# Patient Record
Sex: Female | Born: 1980 | Race: Black or African American | Hispanic: No | Marital: Single | Smoking: Never smoker
Health system: Southern US, Community
[De-identification: ages and names within clinical notes are randomized; demographics above are authoritative.]

## PROBLEM LIST (undated history)

## (undated) ENCOUNTER — Inpatient Hospital Stay (HOSPITAL_COMMUNITY): Payer: Self-pay

## (undated) DIAGNOSIS — G43909 Migraine, unspecified, not intractable, without status migrainosus: Secondary | ICD-10-CM

## (undated) DIAGNOSIS — H6690 Otitis media, unspecified, unspecified ear: Secondary | ICD-10-CM

## (undated) HISTORY — PX: LEEP: SHX91

---

## 2006-09-29 ENCOUNTER — Emergency Department (HOSPITAL_COMMUNITY): Admission: EM | Admit: 2006-09-29 | Discharge: 2006-09-29 | Payer: Self-pay | Admitting: Emergency Medicine

## 2006-10-01 ENCOUNTER — Emergency Department (HOSPITAL_COMMUNITY): Admission: EM | Admit: 2006-10-01 | Discharge: 2006-10-01 | Payer: Self-pay | Admitting: Emergency Medicine

## 2006-11-02 ENCOUNTER — Ambulatory Visit: Payer: Self-pay | Admitting: Internal Medicine

## 2006-11-06 ENCOUNTER — Ambulatory Visit: Payer: Self-pay | Admitting: *Deleted

## 2006-11-29 ENCOUNTER — Ambulatory Visit: Payer: Self-pay | Admitting: Internal Medicine

## 2006-12-19 ENCOUNTER — Ambulatory Visit: Payer: Self-pay | Admitting: Internal Medicine

## 2007-01-01 ENCOUNTER — Encounter: Payer: Self-pay | Admitting: Internal Medicine

## 2007-01-01 ENCOUNTER — Ambulatory Visit: Payer: Self-pay | Admitting: Family Medicine

## 2007-04-21 ENCOUNTER — Emergency Department (HOSPITAL_COMMUNITY): Admission: EM | Admit: 2007-04-21 | Discharge: 2007-04-21 | Payer: Self-pay | Admitting: Emergency Medicine

## 2007-07-24 ENCOUNTER — Emergency Department (HOSPITAL_COMMUNITY): Admission: EM | Admit: 2007-07-24 | Discharge: 2007-07-24 | Payer: Self-pay | Admitting: Emergency Medicine

## 2007-10-29 ENCOUNTER — Ambulatory Visit: Payer: Self-pay | Admitting: Internal Medicine

## 2007-12-24 ENCOUNTER — Ambulatory Visit: Payer: Self-pay | Admitting: Internal Medicine

## 2007-12-24 ENCOUNTER — Encounter: Payer: Self-pay | Admitting: Internal Medicine

## 2007-12-24 LAB — CONVERTED CEMR LAB
BUN: 14 mg/dL (ref 6–23)
Basophils Absolute: 0 10*3/uL (ref 0.0–0.1)
CO2: 21 meq/L (ref 19–32)
Calcium: 9.1 mg/dL (ref 8.4–10.5)
Chlamydia, DNA Probe: NEGATIVE
Cholesterol: 119 mg/dL (ref 0–200)
Creatinine, Ser: 0.78 mg/dL (ref 0.40–1.20)
Glucose, Bld: 73 mg/dL (ref 70–99)
Hemoglobin: 13.2 g/dL (ref 12.0–15.0)
Lymphocytes Relative: 21 % (ref 12–46)
Monocytes Absolute: 0.6 10*3/uL (ref 0.1–1.0)
Potassium: 3.9 meq/L (ref 3.5–5.3)
RDW: 13.1 % (ref 11.5–15.5)
VLDL: 19 mg/dL (ref 0–40)
WBC: 9.1 10*3/uL (ref 4.0–10.5)

## 2008-01-11 ENCOUNTER — Emergency Department (HOSPITAL_COMMUNITY): Admission: EM | Admit: 2008-01-11 | Discharge: 2008-01-11 | Payer: Self-pay | Admitting: Emergency Medicine

## 2008-01-13 ENCOUNTER — Emergency Department (HOSPITAL_COMMUNITY): Admission: EM | Admit: 2008-01-13 | Discharge: 2008-01-13 | Payer: Self-pay | Admitting: Emergency Medicine

## 2008-08-24 ENCOUNTER — Emergency Department (HOSPITAL_COMMUNITY): Admission: EM | Admit: 2008-08-24 | Discharge: 2008-08-24 | Payer: Self-pay | Admitting: Emergency Medicine

## 2008-12-24 ENCOUNTER — Emergency Department (HOSPITAL_BASED_OUTPATIENT_CLINIC_OR_DEPARTMENT_OTHER): Admission: EM | Admit: 2008-12-24 | Discharge: 2008-12-24 | Payer: Self-pay | Admitting: Emergency Medicine

## 2009-01-15 ENCOUNTER — Emergency Department (HOSPITAL_BASED_OUTPATIENT_CLINIC_OR_DEPARTMENT_OTHER): Admission: EM | Admit: 2009-01-15 | Discharge: 2009-01-15 | Payer: Self-pay | Admitting: Emergency Medicine

## 2009-01-16 ENCOUNTER — Emergency Department (HOSPITAL_COMMUNITY): Admission: EM | Admit: 2009-01-16 | Discharge: 2009-01-16 | Payer: Self-pay | Admitting: Emergency Medicine

## 2009-01-19 ENCOUNTER — Emergency Department (HOSPITAL_COMMUNITY): Admission: EM | Admit: 2009-01-19 | Discharge: 2009-01-19 | Payer: Self-pay | Admitting: Family Medicine

## 2009-02-24 ENCOUNTER — Emergency Department (HOSPITAL_COMMUNITY): Admission: EM | Admit: 2009-02-24 | Discharge: 2009-02-24 | Payer: Self-pay | Admitting: Emergency Medicine

## 2009-02-26 ENCOUNTER — Emergency Department (HOSPITAL_BASED_OUTPATIENT_CLINIC_OR_DEPARTMENT_OTHER): Admission: EM | Admit: 2009-02-26 | Discharge: 2009-02-26 | Payer: Self-pay | Admitting: Emergency Medicine

## 2009-05-15 ENCOUNTER — Emergency Department (HOSPITAL_COMMUNITY): Admission: EM | Admit: 2009-05-15 | Discharge: 2009-05-15 | Payer: Self-pay | Admitting: Emergency Medicine

## 2009-10-07 ENCOUNTER — Emergency Department (HOSPITAL_BASED_OUTPATIENT_CLINIC_OR_DEPARTMENT_OTHER): Admission: EM | Admit: 2009-10-07 | Discharge: 2009-10-07 | Payer: Self-pay | Admitting: Emergency Medicine

## 2009-10-07 ENCOUNTER — Ambulatory Visit: Payer: Self-pay | Admitting: Diagnostic Radiology

## 2010-03-24 ENCOUNTER — Emergency Department (HOSPITAL_BASED_OUTPATIENT_CLINIC_OR_DEPARTMENT_OTHER): Admission: EM | Admit: 2010-03-24 | Discharge: 2010-03-24 | Payer: Self-pay | Admitting: Emergency Medicine

## 2010-03-24 ENCOUNTER — Ambulatory Visit: Payer: Self-pay | Admitting: Radiology

## 2010-11-19 LAB — CBC
MCHC: 32.5 g/dL (ref 30.0–36.0)
MCV: 89.2 fL (ref 78.0–100.0)
Platelets: 296 10*3/uL (ref 150–400)

## 2010-11-19 LAB — DIFFERENTIAL
Eosinophils Relative: 1 % (ref 0–5)
Lymphocytes Relative: 13 % (ref 12–46)
Lymphs Abs: 1.3 10*3/uL (ref 0.7–4.0)
Monocytes Absolute: 0.5 10*3/uL (ref 0.1–1.0)
Neutro Abs: 7.6 10*3/uL (ref 1.7–7.7)
Neutrophils Relative %: 79 % — ABNORMAL HIGH (ref 43–77)

## 2010-11-19 LAB — BASIC METABOLIC PANEL
Calcium: 9.1 mg/dL (ref 8.4–10.5)
Chloride: 105 mEq/L (ref 96–112)
GFR calc Af Amer: >60 mL/min (ref >60)
GFR calc non Af Amer: >60 mL/min (ref >60)
Sodium: 144 mEq/L (ref 135–145)

## 2010-11-19 LAB — URINALYSIS, ROUTINE W REFLEX MICROSCOPIC
Hgb urine dipstick: NEGATIVE
Ketones, ur: NEGATIVE mg/dL
Nitrite: NEGATIVE
Urobilinogen, UA: 1 mg/dL (ref 0.0–1.0)
pH: 6 (ref 5.0–8.0)

## 2010-11-23 LAB — URINALYSIS, ROUTINE W REFLEX MICROSCOPIC
Bilirubin Urine: NEGATIVE
Protein, ur: NEGATIVE mg/dL

## 2010-11-23 LAB — GC/CHLAMYDIA PROBE AMP, GENITAL
Chlamydia, DNA Probe: NEGATIVE
GC Probe Amp, Genital: NEGATIVE

## 2010-11-23 LAB — WET PREP, GENITAL

## 2010-12-09 LAB — WET PREP, GENITAL: Trich, Wet Prep: NONE SEEN

## 2010-12-09 LAB — URINALYSIS, ROUTINE W REFLEX MICROSCOPIC
Bilirubin Urine: NEGATIVE
Ketones, ur: NEGATIVE mg/dL
Protein, ur: NEGATIVE mg/dL

## 2010-12-09 LAB — PREGNANCY, URINE: Preg Test, Ur: NEGATIVE

## 2010-12-09 LAB — GC/CHLAMYDIA PROBE AMP, GENITAL: GC Probe Amp, Genital: NEGATIVE

## 2010-12-12 LAB — URINALYSIS, ROUTINE W REFLEX MICROSCOPIC
Nitrite: NEGATIVE
Protein, ur: NEGATIVE mg/dL
Urobilinogen, UA: 1 mg/dL (ref 0.0–1.0)
pH: 5.5 (ref 5.0–8.0)

## 2010-12-12 LAB — URINE MICROSCOPIC-ADD ON

## 2010-12-12 LAB — WET PREP, GENITAL

## 2010-12-12 LAB — GC/CHLAMYDIA PROBE AMP, GENITAL: Chlamydia, DNA Probe: NEGATIVE

## 2010-12-12 LAB — PREGNANCY, URINE: Preg Test, Ur: NEGATIVE

## 2010-12-14 LAB — URINALYSIS, ROUTINE W REFLEX MICROSCOPIC
Bilirubin Urine: NEGATIVE
Ketones, ur: NEGATIVE mg/dL
Nitrite: NEGATIVE
Protein, ur: NEGATIVE mg/dL
Urobilinogen, UA: 1 mg/dL (ref 0.0–1.0)
pH: 5.5 (ref 5.0–8.0)

## 2010-12-14 LAB — WET PREP, GENITAL

## 2010-12-14 LAB — GC/CHLAMYDIA PROBE AMP, GENITAL: Chlamydia, DNA Probe: NEGATIVE

## 2011-02-28 ENCOUNTER — Other Ambulatory Visit: Payer: Self-pay | Admitting: Family Medicine

## 2011-02-28 ENCOUNTER — Other Ambulatory Visit (HOSPITAL_COMMUNITY)
Admission: RE | Admit: 2011-02-28 | Discharge: 2011-02-28 | Disposition: A | Discharge status: Home / Self Care | Payer: BC Managed Care – PPO | Source: Ambulatory Visit | Attending: Family Medicine | Admitting: Family Medicine

## 2011-02-28 DIAGNOSIS — Z124 Encounter for screening for malignant neoplasm of cervix: Secondary | ICD-10-CM | POA: Insufficient documentation

## 2011-06-09 LAB — URINALYSIS, ROUTINE W REFLEX MICROSCOPIC
Bilirubin Urine: NEGATIVE
Nitrite: NEGATIVE
Specific Gravity, Urine: 1.014 (ref 1.005–1.030)
Urobilinogen, UA: 0.2 mg/dL (ref 0.0–1.0)
pH: 5.5 (ref 5.0–8.0)

## 2011-06-09 LAB — PREGNANCY, URINE: Preg Test, Ur: NEGATIVE

## 2011-06-09 LAB — WET PREP, GENITAL: Trich, Wet Prep: NONE SEEN

## 2011-06-09 LAB — GC/CHLAMYDIA PROBE AMP, GENITAL: Chlamydia, DNA Probe: NEGATIVE

## 2011-06-09 LAB — GLUCOSE, CAPILLARY

## 2011-06-13 LAB — URINALYSIS, ROUTINE W REFLEX MICROSCOPIC
Ketones, ur: NEGATIVE
Nitrite: POSITIVE — AB
Protein, ur: 100 — AB
pH: 6

## 2011-06-13 LAB — POCT PREGNANCY, URINE
Operator id: 285491
Preg Test, Ur: NEGATIVE

## 2011-06-13 LAB — URINE MICROSCOPIC-ADD ON

## 2011-06-13 LAB — URINE CULTURE

## 2011-08-10 ENCOUNTER — Emergency Department (HOSPITAL_BASED_OUTPATIENT_CLINIC_OR_DEPARTMENT_OTHER)
Admission: EM | Admit: 2011-08-10 | Discharge: 2011-08-10 | Disposition: A | Discharge status: Home / Self Care | Payer: BC Managed Care – PPO | Attending: Emergency Medicine | Admitting: Emergency Medicine

## 2011-08-10 ENCOUNTER — Encounter: Payer: Self-pay | Admitting: *Deleted

## 2011-08-10 DIAGNOSIS — O2690 Pregnancy related conditions, unspecified, unspecified trimester: Secondary | ICD-10-CM | POA: Insufficient documentation

## 2011-08-10 DIAGNOSIS — R109 Unspecified abdominal pain: Secondary | ICD-10-CM | POA: Insufficient documentation

## 2011-08-10 HISTORY — DX: Migraine, unspecified, not intractable, without status migrainosus: G43.909

## 2011-08-10 LAB — URINALYSIS, ROUTINE W REFLEX MICROSCOPIC
Bilirubin Urine: NEGATIVE
Ketones, ur: NEGATIVE mg/dL
Nitrite: NEGATIVE
Urobilinogen, UA: 0.2 mg/dL (ref 0.0–1.0)

## 2011-08-10 LAB — DIFFERENTIAL
Basophils Absolute: 0 10*3/uL (ref 0.0–0.1)
Basophils Relative: 0 % (ref 0–1)
Eosinophils Absolute: 0.2 10*3/uL (ref 0.0–0.7)
Monocytes Relative: 8 % (ref 3–12)
Neutrophils Relative %: 80 % — ABNORMAL HIGH (ref 43–77)

## 2011-08-10 LAB — COMPREHENSIVE METABOLIC PANEL
Albumin: 3.5 g/dL (ref 3.5–5.2)
Alkaline Phosphatase: 62 U/L (ref 39–117)
BUN: 9 mg/dL (ref 6–23)
Potassium: 3.9 mEq/L (ref 3.5–5.1)
Sodium: 135 mEq/L (ref 135–145)
Total Protein: 7.9 g/dL (ref 6.0–8.3)

## 2011-08-10 LAB — CBC
MCH: 29.6 pg (ref 26.0–34.0)
MCHC: 34.3 g/dL (ref 30.0–36.0)
Platelets: 281 10*3/uL (ref 150–400)
RDW: 12.8 % (ref 11.5–15.5)

## 2011-08-10 LAB — URINE MICROSCOPIC-ADD ON

## 2011-08-10 LAB — LIPASE, BLOOD: Lipase: 25 U/L (ref 11–59)

## 2011-08-10 MED ORDER — SODIUM CHLORIDE 0.9 % IV SOLN
999.0000 mL | INTRAVENOUS | Status: DC
  Administered 2011-08-10: 1000 mL via INTRAVENOUS

## 2011-08-10 NOTE — ED Provider Notes (Signed)
History     CSN: [id number] Arrival date & time: 08/10/2011  6:40 AM   First MD Initiated Contact with Patient 08/10/11 [phone]      Chief Complaint  Patient presents with  . Abdominal Cramping    (Consider location/radiation/quality/duration/timing/severity/associated sxs/prior treatment) HPI Patient presents with complaints of abdominal cramping. She was told this week she is [redacted] weeks pregnant. Last night she had an episode of upper abdominal cramping. She felt flushed as well when this occurred. It did not radiate anywhere. She has not had any vaginal bleeding or discharge. She was told that she had a vaginal infection is taking an antibiotic that she cannot recall. This is her first pregnancy. No other associated symptoms. Symptoms are mild. nothing seems to make it better or worse.  Pt states she saw an OB doctor this week and had the pregnancy test and an ultrasound and was told everything was OK except for the vaginal infection they are treating.  She cannot recall the medication (?flagyl) Past Medical History  Diagnosis Date  . Migraines     History reviewed. No pertinent past surgical history.  No family history on file.  History  Substance Use Topics  . Smoking status: Never Smoker   . Smokeless tobacco: Not on file  . Alcohol Use: Yes     rarely    OB History    Grav Para Term Preterm Abortions TAB SAB Ect Mult Living   1               Review of Systems  All other systems reviewed and are negative.    Allergies  Percocet  Home Medications  No current outpatient prescriptions on file.  BP 135/100  Pulse 84  Temp(Src) 97.8 F (36.6 C) (Oral)  Resp 18  Ht 6' (1.829 m)  Wt 303 lb (137.44 kg)  BMI 41.09 kg/m2  SpO2 100%  Physical Exam  Nursing note and vitals reviewed. Constitutional: She appears well-developed and well-nourished. No distress.  HENT:  Head: Normocephalic and atraumatic.  Right Ear: External ear normal.  Left Ear: External ear  normal.  Eyes: Conjunctivae are normal. Right eye exhibits no discharge. Left eye exhibits no discharge. No scleral icterus.  Neck: Neck supple. No tracheal deviation present.  Cardiovascular: Normal rate, regular rhythm and intact distal pulses.   Pulmonary/Chest: Effort normal and breath sounds normal. No stridor. No respiratory distress. She has no wheezes. She has no rales.  Abdominal: Soft. Bowel sounds are normal. She exhibits no distension and no mass. There is tenderness (mild in the epigastrum). There is no rebound and no guarding.  Genitourinary: Vagina normal. Guaiac negative stool. No vaginal discharge found.       Enlarged uterus, no ttp  Musculoskeletal: She exhibits no edema and no tenderness.  Neurological: She is alert. She has normal strength. No sensory deficit. Cranial nerve deficit:  no gross defecits noted. She exhibits normal muscle tone. She displays no seizure activity. Coordination normal.  Skin: Skin is warm and dry. No rash noted.  Psychiatric: She has a normal mood and affect.    ED Course  Procedures (including critical care time)  Labs Reviewed  URINALYSIS, ROUTINE W REFLEX MICROSCOPIC - Abnormal; Notable for the following:    Leukocytes, UA TRACE (*)    All other components within normal limits  URINE MICROSCOPIC-ADD ON - Abnormal; Notable for the following:    Bacteria, UA FEW (*)    All other components within normal limits  CBC  DIFFERENTIAL  COMPREHENSIVE METABOLIC PANEL  LIPASE, BLOOD  PREGNANCY, URINE  ABO/RH   No results found.   MDM  Pt with mild symptoms.  Worried because she is pregnant although exam is reassuring.  Will check labs and reassess.  Doubt threatened ab, biliary colic, pancreatitis, appendicitis.  Will turn over to Dr. Alto Denver.       Celene Kras, MD 08/10/11 [phone]

## 2011-08-10 NOTE — ED Provider Notes (Signed)
Patient had return of laboratory workup. Slight leukocytosis is likely related to pregnancy. Urinalysis was unremarkable. Patient is already being treated with what sounds like Flagyl for a bacterial vaginosis. Liver panel and lipase were within normal limits. Patient was reassessed. She was safe for discharge home. Patient was discharged home in good condition with a note for work.  Cyndra Numbers, MD 08/10/11 0800

## 2011-08-10 NOTE — ED Notes (Addendum)
Was woken up from sleep tonight with lower abdominal cramping. States yesterday she had upper abdominal cramping that was intermittent and now resolved. Was told this week that she was [redacted] weeks pregnant. Denies vaginal bleeding. C/o urinary frequency only. Had PAP performed and was told she had a vaginal infection Tuesday. Started on ABX.

## 2011-08-11 LAB — GC/CHLAMYDIA PROBE AMP, GENITAL: GC Probe Amp, Genital: NEGATIVE

## 2011-09-05 NOTE — L&D Delivery Note (Signed)
Delivery Note   C/C/+2 at 2009 Onset active 2nd stage 2010 FHR cat 1 in 2nd stage  At 8:29 PM a viable female was delivered via Vaginal, Spontaneous Delivery (Presentation: Right Occiput Anterior, compound R arm).  APGAR: 8, 9; weight 8 lb 12.9 oz (3995 g).   Placenta status: Intact, Spontaneous, for disposal.  Cord: 3 vessels with the following complications: None.  Cord pH: not done. Cord clamped and cut at 5 min of life.   Anesthesia: Epidural  Episiotomy: None Lacerations: 1st degree perineal, vaginal floor sulcus w/ brisk bleed, hemostasis with figure 8 tie. Perineal and sulcal tears repair then completed in standard fashion. Bilateral periurethral splays approximated w/ 4.0 vicryl. Suture Repair: 2.0 vicryl rapide Est. Blood Loss (mL): 450  Mom to postpartum.  Baby to mother for skin-to-skin care.  PAUL,DANIELA 03/25/2012, 9:16 PM

## 2011-09-07 ENCOUNTER — Encounter: Payer: BC Managed Care – PPO | Admitting: *Deleted

## 2011-09-07 DIAGNOSIS — E669 Obesity, unspecified: Secondary | ICD-10-CM | POA: Insufficient documentation

## 2011-09-07 DIAGNOSIS — Z6841 Body Mass Index (BMI) 40.0 and over, adult: Secondary | ICD-10-CM | POA: Insufficient documentation

## 2011-09-07 DIAGNOSIS — Z713 Dietary counseling and surveillance: Secondary | ICD-10-CM | POA: Insufficient documentation

## 2011-09-07 NOTE — Progress Notes (Signed)
  Medical Nutrition Therapy:  Appt start time: 1200 end time:  1300.   Assessment:  Primary concerns today: Patient states she is [redacted] weeks pregnant and is 50 pounds above her usual weight of 250-260 pounds. 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 Her eating habits have not changed much since college.   MEDICATIONS: see list   DIETARY INTAKE:  Usual eating pattern includes 3 meals and 3 snacks per day.  Everyday foods include good variety of all food groups.     24-hr recall:  B ( AM):2-3 boiled or scrambled eggs, sausage or bacon, waffles or grits/ oats, occasionally biscuit   Snk ( AM): granola bar or fruit or PNB crackers or Veggie chips, popcorn  L ( PM): bring or buy; salad, OR Chic Filet or Subway OR Burger with fries, reg Sprite or water, occasionally apple juice  Snk ( PM): similar to AM snack D ( PM): eat out usually; sit down restaurant- meat, starch and veg, bread, Sprite or water Snk ( PM): used to eat dessert, not lately. Fruit cup Beverages: water, Sprite, apple juice, Gingerale  Usual physical activity: less than usual, history of basketball, working out at gym 3 times / week and boot camp  Estimated energy needs: 2200 calories for weight maintenance during pregnancy 235 g carbohydrates 158 g protein 58 g fat  Progress Towards Goal(s):  In progress.   Nutritional Diagnosis:  NI-1.5 Excessive energy intake As related to current activity level.  As evidenced by BMI of 41.7 %.    Intervention:  Nutrition counseling and pregnancy nutrition guidelines provided. Goals:  Eat 3 meals/day, Avoid meal skipping   Increase protein rich foods  Follow "Plate Method" for portion control  Include carbohydrate 3-4 servings/meal   Choose more whole grains, lean protein, low-fat dairy, and fruits/non-starchy vegetables.   Aim for >30 min of physical activity daily as tolerated with pregnancy  Limit sugar-sweetened beverages and  concentrated sweets    Handouts given during visit include:  Carb Counting and Label Reading handouts  Sample Meal Plan adapted from GDM handout  Monitoring/Evaluation:  Dietary intake, exercise, Food Labels, and body weight in 4 week(s).

## 2011-09-11 ENCOUNTER — Encounter: Payer: Self-pay | Admitting: *Deleted

## 2011-09-11 NOTE — Patient Instructions (Signed)
Goals:  Eat 3 meals/day, Avoid meal skipping   Increase protein rich foods  Follow "Plate Method" for portion control  Include carbohydrate 3-4 servings/meal   Choose more whole grains, lean protein, low-fat dairy, and fruits/non-starchy vegetables.   Aim for >30 min of physical activity daily as tolerated with pregnancy  Limit sugar-sweetened beverages and concentrated sweets

## 2011-12-21 ENCOUNTER — Ambulatory Visit (HOSPITAL_COMMUNITY): Payer: BC Managed Care – PPO

## 2012-01-04 ENCOUNTER — Encounter: Payer: BC Managed Care – PPO | Admitting: Dietician

## 2012-01-04 ENCOUNTER — Ambulatory Visit (HOSPITAL_COMMUNITY)
Admission: RE | Admit: 2012-01-04 | Discharge: 2012-01-04 | Disposition: A | Discharge status: Home / Self Care | Payer: BC Managed Care – PPO | Source: Ambulatory Visit

## 2012-01-04 DIAGNOSIS — O99810 Abnormal glucose complicating pregnancy: Secondary | ICD-10-CM | POA: Insufficient documentation

## 2012-01-04 DIAGNOSIS — Z713 Dietary counseling and surveillance: Secondary | ICD-10-CM | POA: Insufficient documentation

## 2012-01-04 NOTE — ED Notes (Signed)
Diabetes Education:G1 P0 for diabetes education:  EDD: 04/01/2012.  One hour GTT: 127 but has "gained a lot of weight in a short amount of time and the CNM felt that I Kamran Coker be having some glucose problem and Neleh Muldoon develop GDM."  Completed review of the GDM diet and carb counting.  Provided handout Nutrition, Diabetes and Pregnancy and Carb Counting Guide.  Provided a One Touch Ultra Mini Glucose meter.  AVW;[id number] X EXP:  07/2012.  Completed return demonstration of blood glucose testing.  Glucose level was 92 mg at 3:30 in the afternoon.  Maggie Laprecious Austill, RN,RD,CDE.

## 2012-02-17 ENCOUNTER — Inpatient Hospital Stay (HOSPITAL_COMMUNITY)
Admission: AD | Admit: 2012-02-17 | Discharge: 2012-02-17 | Disposition: A | Discharge status: Home / Self Care | Payer: BC Managed Care – PPO | Source: Ambulatory Visit | Attending: Obstetrics and Gynecology | Admitting: Obstetrics and Gynecology

## 2012-02-17 ENCOUNTER — Encounter (HOSPITAL_COMMUNITY): Payer: Self-pay | Admitting: *Deleted

## 2012-02-17 DIAGNOSIS — O99810 Abnormal glucose complicating pregnancy: Secondary | ICD-10-CM | POA: Insufficient documentation

## 2012-02-17 DIAGNOSIS — N760 Acute vaginitis: Secondary | ICD-10-CM

## 2012-02-17 DIAGNOSIS — O26859 Spotting complicating pregnancy, unspecified trimester: Secondary | ICD-10-CM | POA: Insufficient documentation

## 2012-02-17 LAB — URINALYSIS, ROUTINE W REFLEX MICROSCOPIC
Bilirubin Urine: NEGATIVE
Hgb urine dipstick: NEGATIVE
Specific Gravity, Urine: 1.01 (ref 1.005–1.030)
Urobilinogen, UA: 0.2 mg/dL (ref 0.0–1.0)
pH: 6 (ref 5.0–8.0)

## 2012-02-17 LAB — WET PREP, GENITAL
Trich, Wet Prep: NONE SEEN
Yeast Wet Prep HPF POC: NONE SEEN

## 2012-02-17 MED ORDER — METRONIDAZOLE 500 MG PO TABS: 500.0000 mg | ORAL_TABLET | Freq: Two times a day (BID) | ORAL | Status: DC

## 2012-02-17 MED ORDER — TERCONAZOLE 0.4 % VA CREA: 1.0000 | TOPICAL_CREAM | Freq: Every day | VAGINAL | Status: AC

## 2012-02-17 NOTE — Discharge Instructions (Signed)
Vaginitis - mixed infection with bacterial and yeast.   Complete all prescriptions - metronidazole 500mg  twice daily x 5 days and terazol vaginal cream intravaginal x 7 nights at bedtime May soak in tub with baking soda nightly prior to redose of medication to wash out vaginal discharge / medication

## 2012-02-17 NOTE — MAU Provider Note (Signed)
  History     CSN: [id number]  Arrival date and time: 02/17/12 1126 Nurse contact - orders given at 1153 Provider in to examine patient at 1226     Chief Complaint  Patient presents with  . Vaginal Bleeding   HPI Reports cramping x 3 days off an on - persistent all morning Increased vaginal discharge - no odor / +  itching                           used Luvena (prebiotic) Monday with relief of symptoms Some pink on tissue with wiping this am No active bleeding or LOF No recent intercourse per patient - none in over a month + FM  Significant OB hx - GDMA1 with LGA fetus  Past Medical History  Diagnosis Date  . Migraines     Past Surgical History  Procedure Date  . Leep     No family history on file.  History  Substance Use Topics  . Smoking status: Never Smoker   . Smokeless tobacco: Not on file  . Alcohol Use: Yes     rarely    Allergies:  Allergies  Allergen Reactions  . Percocet (Oxycodone-Acetaminophen) Anaphylaxis    Prescriptions prior to admission  Medication Sig Dispense Refill  . metroNIDAZOLE (FLAGYL) 500 MG tablet Take 500 mg by mouth 2 (two) times daily.          ROS Physical Exam   Blood pressure 138/85, pulse 95, temperature 98.4 F (36.9 C), temperature source Oral, resp. rate 18, height 6\' 1"  (1.854 m), weight 150.594 kg (332 lb).  Physical Exam  Alert and oriented x 3 NAD Abdomen soft and non-tender Uterus non-tender and gravid  Spec exam : moderate thick white discharge adherent to side walls - wet prep sent                       Small pink color at introitus from excoriation                       No blood in vaginal vault                       Cervix : non-friable / appears closed visually  Cervical exam : closed / soft / <50% / presenting part ballotable  FHR: reactive / no decels / category 1 tracing TOCO: no ctx or uterine irritability  MAU Course  Procedures   urinalysis: normal / spec gravity 1010 / negative blood  / negative nitrites  Wet prep:   Assessment and Plan  34 weeks /GDM-A1 with pink spotting and vaginal discharge  No evidence of active bleeding  No evidence of preterm labor Mixed vaginitis  1) discharge home 2) treat mixed vaginitis w/ terazol-7 & flagyl 3) maintain adequate water hydration 4) ROB Thursday as scheduled   Marlinda Mike 02/17/2012, 12:33 PM

## 2012-02-17 NOTE — MAU Note (Signed)
Pt reports she has been having some abd cramping on and off for 2-3 days. Noticed some spotting today when she wiped and still having the cramping.

## 2012-03-15 ENCOUNTER — Encounter (HOSPITAL_COMMUNITY): Payer: Self-pay | Admitting: Emergency Medicine

## 2012-03-15 ENCOUNTER — Emergency Department (HOSPITAL_COMMUNITY)
Admission: EM | Admit: 2012-03-15 | Discharge: 2012-03-15 | Disposition: A | Discharge status: Home / Self Care | Payer: BC Managed Care – PPO | Attending: Emergency Medicine | Admitting: Emergency Medicine

## 2012-03-15 DIAGNOSIS — H6091 Unspecified otitis externa, right ear: Secondary | ICD-10-CM

## 2012-03-15 DIAGNOSIS — H60399 Other infective otitis externa, unspecified ear: Secondary | ICD-10-CM | POA: Insufficient documentation

## 2012-03-15 DIAGNOSIS — H9209 Otalgia, unspecified ear: Secondary | ICD-10-CM | POA: Insufficient documentation

## 2012-03-15 MED ORDER — CIPROFLOXACIN-DEXAMETHASONE 0.3-0.1 % OT SUSP
OTIC | Status: AC
  Filled 2012-03-15: qty 7.5

## 2012-03-15 MED ORDER — CIPROFLOXACIN-DEXAMETHASONE 0.3-0.1 % OT SUSP
4.0000 [drp] | Freq: Once | OTIC | Status: AC
  Administered 2012-03-15: 4 [drp] via OTIC

## 2012-03-15 MED ORDER — CIPROFLOXACIN-DEXAMETHASONE 0.3-0.1 % OT SUSP: 4.0000 [drp] | Freq: Once | OTIC | Status: DC

## 2012-03-15 NOTE — ED Provider Notes (Signed)
Medical screening examination/treatment/procedure(s) were performed by non-physician practitioner and as supervising physician I was immediately available for consultation/collaboration.   Gerhard Munch, MD 03/15/12 [phone]

## 2012-03-15 NOTE — ED Notes (Signed)
Pt alert, nad, c/o right ear pain, onset was this am, resp even unlabored, skin pwd. Recent URI

## 2012-03-15 NOTE — ED Provider Notes (Signed)
History     CSN: [id number]  Arrival date & time 03/15/12  2027   First MD Initiated Contact with Patient 03/15/12 2110      Chief Complaint  Patient presents with  . Otalgia    (Consider location/radiation/quality/duration/timing/severity/associated sxs/prior treatment) HPI  31 year old female who recently was diagnosed with an upper respiratory infection is presents complaining of ear pain. Patient noticed pain to her right ear this morning.  Described pain as sharp, throbbing, worsening when she lays on her R side of face or when she touches her ear. Pain radiates down to neck.  She does admits to having mild nasal congestion and throat irritation for the past few days but that has resolved.  Denies fever, ringing in ear, hearing changes, ear drainage.  Denies swimming in pool or lake.  Is [redacted] weeks pregnant.  Past Medical History  Diagnosis Date  . Migraines     Past Surgical History  Procedure Date  . Leep     No family history on file.  History  Substance Use Topics  . Smoking status: Never Smoker   . Smokeless tobacco: Not on file  . Alcohol Use: Yes     rarely    OB History    Grav Para Term Preterm Abortions TAB SAB Ect Mult Living   1               Review of Systems  All other systems reviewed and are negative.    Allergies  Percocet  Home Medications   Current Outpatient Rx  Name Route Sig Dispense Refill  . PRE-NATAL FORMULA PO TABS Oral Take 1 tablet by mouth daily.      BP 131/72  Pulse 105  Temp 97.5 F (36.4 C) (Oral)  Resp 18  SpO2 98%  Physical Exam  Nursing note and vitals reviewed. Constitutional: She appears well-developed and well-nourished.  HENT:  Head: Normocephalic and atraumatic.  Right Ear: There is swelling and tenderness. No drainage. No foreign bodies. There is mastoid tenderness. No decreased hearing is noted.  Left Ear: No drainage, swelling or tenderness. No foreign bodies. No decreased hearing is noted.  Nose:  Nose normal.  Mouth/Throat: Oropharynx is clear and moist. No oropharyngeal exudate.  Eyes: Conjunctivae are normal.  Neck: Neck supple.  Musculoskeletal: Normal range of motion. She exhibits no edema.  Lymphadenopathy:    She has cervical adenopathy.  Skin: Skin is warm. No rash noted.  Psychiatric: She has a normal mood and affect.    ED Course  Procedures (including critical care time)  Labs Reviewed - No data to display No results found.   No diagnosis found.  1. Otitis externa  MDM  R ear pain and swelling to ear canal consistent with otitis externa.  Will prescribe cipro-otic ear drops with ear wick.  Pt is [redacted] weeks pregnant. Is safe to use abx.  Referral to ENT given.  Care instruction given.          Fayrene Helper, PA-C 03/15/12 2229

## 2012-03-17 ENCOUNTER — Emergency Department (HOSPITAL_COMMUNITY)
Admission: EM | Admit: 2012-03-17 | Discharge: 2012-03-17 | Disposition: A | Discharge status: Home / Self Care | Payer: BC Managed Care – PPO | Attending: Emergency Medicine | Admitting: Emergency Medicine

## 2012-03-17 ENCOUNTER — Encounter (HOSPITAL_COMMUNITY): Payer: Self-pay | Admitting: *Deleted

## 2012-03-17 DIAGNOSIS — H60399 Other infective otitis externa, unspecified ear: Secondary | ICD-10-CM | POA: Insufficient documentation

## 2012-03-17 DIAGNOSIS — H6090 Unspecified otitis externa, unspecified ear: Secondary | ICD-10-CM

## 2012-03-17 DIAGNOSIS — O9989 Other specified diseases and conditions complicating pregnancy, childbirth and the puerperium: Secondary | ICD-10-CM | POA: Insufficient documentation

## 2012-03-17 HISTORY — DX: Otitis media, unspecified, unspecified ear: H66.90

## 2012-03-17 MED ORDER — ANTIPYRINE-BENZOCAINE 5.4-1.4 % OT SOLN
3.0000 [drp] | Freq: Once | OTIC | Status: AC
  Administered 2012-03-17: 4 [drp] via OTIC
  Filled 2012-03-17: qty 10

## 2012-03-17 NOTE — ED Provider Notes (Signed)
History     CSN: [id number]  Arrival date & time 03/17/12  0118   First MD Initiated Contact with Patient 03/17/12 0136      Chief Complaint  Patient presents with  . Otalgia    (Consider location/radiation/quality/duration/timing/severity/associated sxs/prior treatment) HPI Comments: Yesenia Villegas is a 31 year old pregnant African American female who presents today with a chief complaint of otalgia. [name] states that her pain began Friday morning and she was seen at [facility] ED on Friday. She was diagnosed with otitis externa of her right ear and was given ciprofloxacin otic drops and ear wicks. Patient has returned today due to worsening pain. She also reports tenderness over her mastoid area and the lateral aspect of her neck on the right side. [name] denies fevers, chills, vomiting, weakness, or neck stiffness.  Patient is a 31 y.o. female presenting with ear pain.  Otalgia Pertinent negatives include no ear discharge, no headaches, no hearing loss, no rhinorrhea, no sore throat, no abdominal pain, no diarrhea, no vomiting, no neck pain, no cough and no rash.    Past Medical History  Diagnosis Date  . Migraines   . Ear infection     Past Surgical History  Procedure Date  . Leep     History reviewed. No pertinent family history.  History  Substance Use Topics  . Smoking status: Never Smoker   . Smokeless tobacco: Not on file  . Alcohol Use: Yes     rarely    OB History    Grav Para Term Preterm Abortions TAB SAB Ect Mult Living   1               Review of Systems  Constitutional: Negative for fever.  HENT: Positive for ear pain. Negative for hearing loss, sore throat, rhinorrhea, neck pain, neck stiffness and ear discharge.   Eyes: Negative for redness.  Respiratory: Negative for cough.   Cardiovascular: Negative for chest pain.  Gastrointestinal: Negative for nausea, vomiting, abdominal pain and diarrhea.  Genitourinary: Negative for  dysuria.  Musculoskeletal: Negative for myalgias.  Skin: Negative for rash.  Neurological: Negative for headaches.    Allergies  Percocet  Home Medications   Current Outpatient Rx  Name Route Sig Dispense Refill  . ACETAMINOPHEN 500 MG PO TABS Oral Take 500 mg by mouth every 6 (six) hours as needed. For pain    . BLACK COHOSH PO Oral Take 1 tablet by mouth daily.    Marland Kitchen CIPROFLOXACIN-DEXAMETHASONE 0.3-0.1 % OT SUSP Right Ear Place 4 drops into the right ear every 6 (six) hours.    Marland Kitchen PRE-NATAL FORMULA PO TABS Oral Take 1 tablet by mouth daily.      BP 133/88  Pulse 99  Temp 98.6 F (37 C) (Oral)  Resp 18  SpO2 96%  Physical Exam  Nursing note and vitals reviewed. Constitutional: She appears well-developed and well-nourished. No distress.  HENT:  Head: Normocephalic and atraumatic.  Right Ear: There is swelling and tenderness.  Left Ear: Tympanic membrane, external ear and ear canal normal. No swelling or tenderness.  Nose: Nose normal.  Mouth/Throat: Uvula is midline, oropharynx is clear and moist and mucous membranes are normal.       Tenderness with movement of pinna and with speculum into ear canal. There is no drainage from ear. There is a moderate amount of debris in canal with erythema and swelling. There is no swelling around the ear or of the auricle. There is mild  tenderness of superior neck and over mastoid, without swelling, redness, or warmth. Exam is consistent with otitis externa. It is not consistent and do not suspect malignant otitis externa or osteo.   Eyes: Conjunctivae are normal. Pupils are equal, round, and reactive to light. Right eye exhibits no discharge. Left eye exhibits no discharge.  Neck: Normal range of motion. Neck supple.  Cardiovascular: Normal rate, regular rhythm and normal heart sounds.   Pulmonary/Chest: Effort normal and breath sounds normal.  Abdominal: Soft. There is no tenderness.  Lymphadenopathy:    She has no cervical adenopathy.    Neurological: She is alert.  Skin: Skin is warm and dry.  Psychiatric: She has a normal mood and affect.    ED Course  Procedures (including critical care time)  Labs Reviewed - No data to display No results found.   1. Otitis externa     Patient seen and examined. Medications ordered. Auralgan drops given by nurse with minimal relief of pain.   Vital signs reviewed and are as follows: Filed Vitals:   03/17/12 0122  BP: 133/88  Pulse: 99  Temp: 98.6 F (37 C)  Resp: 18   Ear wick replaced by myself and patient given 2 more for use at home. Urged replacing wick 24-48 hours. Urged continued use of antibiotics with ENT/PCP follow-up to ensure symptom resolution.   Patient requested pain medication -- she has facial and mouth swelling with percocet so unfortunately we need to avoid opioid analgesics. Patient urged to continue tylenol, avoid NSAIDs as she is [redacted] wks pregnant, continue auralgan 3-4 drops q6h, do not give with abx.   Urged return with fever, worsening pain or swelling, especially around ear.    MDM  Agree with previous diagnosis of otitis externa. Does not appear to be malignant otitis externa. No fever, no risk factors, no external swelling, mild pain around ear. Patient given additional supportive treatment.        Renne Crigler, [place] 03/18/12 1743

## 2012-03-17 NOTE — ED Notes (Signed)
Pt states she was seen at [facility] yesterday and dx with ear infection, given cipro drops.  Today, pain is worse in right ear.  Also c/o right neck pain.  Denies fevers, n/v.

## 2012-03-20 NOTE — ED Provider Notes (Signed)
Medical screening examination/treatment/procedure(s) were performed by non-physician practitioner and as supervising physician I was immediately available for consultation/collaboration.  Sunnie Nielsen, MD 03/20/12 2250

## 2012-03-25 ENCOUNTER — Inpatient Hospital Stay (HOSPITAL_COMMUNITY)
Admission: RE | Admit: 2012-03-25 | Discharge: 2012-03-27 | DRG: 373 | Disposition: A | Discharge status: Home / Self Care | Payer: BC Managed Care – PPO | Source: Ambulatory Visit | Attending: Obstetrics & Gynecology | Admitting: Obstetrics & Gynecology

## 2012-03-25 ENCOUNTER — Inpatient Hospital Stay (HOSPITAL_COMMUNITY): Payer: BC Managed Care – PPO | Admitting: Anesthesiology

## 2012-03-25 ENCOUNTER — Encounter (HOSPITAL_COMMUNITY): Payer: Self-pay | Admitting: *Deleted

## 2012-03-25 ENCOUNTER — Encounter (HOSPITAL_COMMUNITY): Payer: Self-pay | Admitting: Anesthesiology

## 2012-03-25 DIAGNOSIS — O99892 Other specified diseases and conditions complicating childbirth: Secondary | ICD-10-CM | POA: Diagnosis present

## 2012-03-25 DIAGNOSIS — Z22330 Carrier of Group B streptococcus: Secondary | ICD-10-CM

## 2012-03-25 DIAGNOSIS — O3660X0 Maternal care for excessive fetal growth, unspecified trimester, not applicable or unspecified: Secondary | ICD-10-CM | POA: Diagnosis present

## 2012-03-25 LAB — CBC
HCT: 35.5 % — ABNORMAL LOW (ref 36.0–46.0)
MCV: 90.8 fL (ref 78.0–100.0)
RBC: 3.91 MIL/uL (ref 3.87–5.11)
WBC: 11.4 10*3/uL — ABNORMAL HIGH (ref 4.0–10.5)

## 2012-03-25 LAB — COMPREHENSIVE METABOLIC PANEL
ALT: 26 U/L (ref 0–35)
Albumin: 2.6 g/dL — ABNORMAL LOW (ref 3.5–5.2)
Alkaline Phosphatase: 151 U/L — ABNORMAL HIGH (ref 39–117)
BUN: 6 mg/dL (ref 6–23)
Chloride: 102 mEq/L (ref 96–112)
GFR calc Af Amer: >90 mL/min (ref >90)
Glucose, Bld: 114 mg/dL — ABNORMAL HIGH (ref 70–99)
Potassium: 4 mEq/L (ref 3.5–5.1)
Sodium: 137 mEq/L (ref 135–145)
Total Bilirubin: 0.3 mg/dL (ref 0.3–1.2)

## 2012-03-25 LAB — ABO/RH: ABO/RH(D): B POS

## 2012-03-25 LAB — OB RESULTS CONSOLE RPR: RPR: NONREACTIVE

## 2012-03-25 LAB — OB RESULTS CONSOLE ANTIBODY SCREEN: Antibody Screen: NEGATIVE

## 2012-03-25 MED ORDER — TRAMADOL HCL 50 MG PO TABS: 50.0000 mg | ORAL_TABLET | Freq: Four times a day (QID) | ORAL | Status: DC | PRN

## 2012-03-25 MED ORDER — IBUPROFEN 600 MG PO TABS
600.0000 mg | ORAL_TABLET | Freq: Four times a day (QID) | ORAL | Status: DC | PRN
  Administered 2012-03-25: 600 mg via ORAL
  Filled 2012-03-25: qty 1

## 2012-03-25 MED ORDER — BENZOCAINE-MENTHOL 20-0.5 % EX AERO
1.0000 "application " | INHALATION_SPRAY | CUTANEOUS | Status: DC | PRN
  Administered 2012-03-25: 1 via TOPICAL
  Filled 2012-03-25: qty 56

## 2012-03-25 MED ORDER — LANOLIN HYDROUS EX OINT: TOPICAL_OINTMENT | CUTANEOUS | Status: DC | PRN

## 2012-03-25 MED ORDER — EPHEDRINE 5 MG/ML INJ: 10.0000 mg | INTRAVENOUS | Status: DC | PRN

## 2012-03-25 MED ORDER — PHENYLEPHRINE 40 MCG/ML (10ML) SYRINGE FOR IV PUSH (FOR BLOOD PRESSURE SUPPORT)
80.0000 ug | PREFILLED_SYRINGE | INTRAVENOUS | Status: DC | PRN
  Filled 2012-03-25: qty 5

## 2012-03-25 MED ORDER — ONDANSETRON HCL 4 MG PO TABS: 4.0000 mg | ORAL_TABLET | ORAL | Status: DC | PRN

## 2012-03-25 MED ORDER — DIPHENHYDRAMINE HCL 50 MG/ML IJ SOLN: 12.5000 mg | INTRAMUSCULAR | Status: DC | PRN

## 2012-03-25 MED ORDER — FLEET ENEMA 7-19 GM/118ML RE ENEM: 1.0000 | ENEMA | RECTAL | Status: DC | PRN

## 2012-03-25 MED ORDER — SODIUM CHLORIDE 0.9 % IJ SOLN: 3.0000 mL | Freq: Two times a day (BID) | INTRAMUSCULAR | Status: DC

## 2012-03-25 MED ORDER — TETANUS-DIPHTH-ACELL PERTUSSIS 5-2.5-18.5 LF-MCG/0.5 IM SUSP
0.5000 mL | Freq: Once | INTRAMUSCULAR | Status: AC
  Administered 2012-03-27: 0.5 mL via INTRAMUSCULAR

## 2012-03-25 MED ORDER — SODIUM CHLORIDE 0.9 % IJ SOLN: 3.0000 mL | INTRAMUSCULAR | Status: DC | PRN

## 2012-03-25 MED ORDER — SODIUM CHLORIDE 0.9 % IV SOLN: 250.0000 mL | INTRAVENOUS | Status: DC | PRN

## 2012-03-25 MED ORDER — SIMETHICONE 80 MG PO CHEW: 80.0000 mg | CHEWABLE_TABLET | ORAL | Status: DC | PRN

## 2012-03-25 MED ORDER — SENNOSIDES-DOCUSATE SODIUM 8.6-50 MG PO TABS
2.0000 | ORAL_TABLET | Freq: Every day | ORAL | Status: DC
  Administered 2012-03-26: 2 via ORAL

## 2012-03-25 MED ORDER — FENTANYL 2.5 MCG/ML BUPIVACAINE 1/10 % EPIDURAL INFUSION (WH - ANES)
14.0000 mL/h | INTRAMUSCULAR | Status: DC
  Administered 2012-03-25: 16 mL/h via EPIDURAL
  Administered 2012-03-25: 14 mL/h via EPIDURAL
  Filled 2012-03-25 (×2): qty 60

## 2012-03-25 MED ORDER — WITCH HAZEL-GLYCERIN EX PADS: 1.0000 "application " | MEDICATED_PAD | CUTANEOUS | Status: DC | PRN

## 2012-03-25 MED ORDER — IBUPROFEN 600 MG PO TABS
600.0000 mg | ORAL_TABLET | Freq: Four times a day (QID) | ORAL | Status: DC
  Administered 2012-03-26 – 2012-03-27 (×6): 600 mg via ORAL
  Filled 2012-03-25 (×5): qty 1

## 2012-03-25 MED ORDER — OXYTOCIN 40 UNITS IN LACTATED RINGERS INFUSION - SIMPLE MED
1.0000 m[IU]/min | INTRAVENOUS | Status: DC
  Administered 2012-03-25: 2 m[IU]/min via INTRAVENOUS
  Filled 2012-03-25: qty 1000

## 2012-03-25 MED ORDER — PRENATAL MULTIVITAMIN CH
1.0000 | ORAL_TABLET | Freq: Every day | ORAL | Status: DC
  Administered 2012-03-26 – 2012-03-27 (×2): 1 via ORAL
  Filled 2012-03-25 (×2): qty 1

## 2012-03-25 MED ORDER — LIDOCAINE HCL (PF) 1 % IJ SOLN
INTRAMUSCULAR | Status: DC | PRN
  Administered 2012-03-25 (×4): 4 mL

## 2012-03-25 MED ORDER — ONDANSETRON HCL 4 MG/2ML IJ SOLN: 4.0000 mg | Freq: Four times a day (QID) | INTRAMUSCULAR | Status: DC | PRN

## 2012-03-25 MED ORDER — BISACODYL 10 MG RE SUPP: 10.0000 mg | Freq: Every day | RECTAL | Status: DC | PRN

## 2012-03-25 MED ORDER — FLEET ENEMA 7-19 GM/118ML RE ENEM: 1.0000 | ENEMA | Freq: Every day | RECTAL | Status: DC | PRN

## 2012-03-25 MED ORDER — TERBUTALINE SULFATE 1 MG/ML IJ SOLN: 0.2500 mg | Freq: Once | INTRAMUSCULAR | Status: DC | PRN

## 2012-03-25 MED ORDER — LACTATED RINGERS IV SOLN
500.0000 mL | Freq: Once | INTRAVENOUS | Status: AC
  Administered 2012-03-25: 500 mL via INTRAVENOUS

## 2012-03-25 MED ORDER — EPHEDRINE 5 MG/ML INJ
10.0000 mg | INTRAVENOUS | Status: DC | PRN
  Filled 2012-03-25: qty 4

## 2012-03-25 MED ORDER — ONDANSETRON HCL 4 MG/2ML IJ SOLN: 4.0000 mg | INTRAMUSCULAR | Status: DC | PRN

## 2012-03-25 MED ORDER — DIPHENHYDRAMINE HCL 25 MG PO CAPS: 25.0000 mg | ORAL_CAPSULE | Freq: Four times a day (QID) | ORAL | Status: DC | PRN

## 2012-03-25 MED ORDER — OXYTOCIN BOLUS FROM INFUSION
250.0000 mL | Freq: Once | INTRAVENOUS | Status: AC
  Administered 2012-03-25: 250 mL via INTRAVENOUS
  Filled 2012-03-25: qty 500

## 2012-03-25 MED ORDER — LACTATED RINGERS IV SOLN
INTRAVENOUS | Status: DC
  Administered 2012-03-25 (×2): via INTRAVENOUS

## 2012-03-25 MED ORDER — CITRIC ACID-SODIUM CITRATE 334-500 MG/5ML PO SOLN: 30.0000 mL | ORAL | Status: DC | PRN

## 2012-03-25 MED ORDER — ZOLPIDEM TARTRATE 5 MG PO TABS: 5.0000 mg | ORAL_TABLET | Freq: Every evening | ORAL | Status: DC | PRN

## 2012-03-25 MED ORDER — LACTATED RINGERS IV SOLN: 500.0000 mL | INTRAVENOUS | Status: DC | PRN

## 2012-03-25 MED ORDER — PHENYLEPHRINE 40 MCG/ML (10ML) SYRINGE FOR IV PUSH (FOR BLOOD PRESSURE SUPPORT): 80.0000 ug | PREFILLED_SYRINGE | INTRAVENOUS | Status: DC | PRN

## 2012-03-25 MED ORDER — PENICILLIN G POTASSIUM 5000000 UNITS IJ SOLR
2.5000 10*6.[IU] | INTRAVENOUS | Status: DC
  Filled 2012-03-25 (×4): qty 2.5

## 2012-03-25 MED ORDER — LIDOCAINE HCL (PF) 1 % IJ SOLN
30.0000 mL | INTRAMUSCULAR | Status: DC | PRN
  Administered 2012-03-25: 30 mL via SUBCUTANEOUS
  Filled 2012-03-25: qty 30

## 2012-03-25 MED ORDER — NYSTATIN-TRIAMCINOLONE 100000-0.1 UNIT/GM-% EX OINT
TOPICAL_OINTMENT | Freq: Two times a day (BID) | CUTANEOUS | Status: DC
  Administered 2012-03-25 – 2012-03-27 (×3): via TOPICAL
  Filled 2012-03-25: qty 15

## 2012-03-25 MED ORDER — PENICILLIN G POTASSIUM 5000000 UNITS IJ SOLR
5.0000 10*6.[IU] | Freq: Once | INTRAVENOUS | Status: AC
  Administered 2012-03-25: 5 10*6.[IU] via INTRAVENOUS
  Filled 2012-03-25: qty 5

## 2012-03-25 MED ORDER — DIBUCAINE 1 % RE OINT: 1.0000 "application " | TOPICAL_OINTMENT | RECTAL | Status: DC | PRN

## 2012-03-25 MED ORDER — OXYTOCIN 40 UNITS IN LACTATED RINGERS INFUSION - SIMPLE MED: 62.5000 mL/h | Freq: Once | INTRAVENOUS | Status: DC

## 2012-03-25 NOTE — Progress Notes (Signed)
S: Doing well, pain controlled with Epidural, + pelvic pressure.  OCeasar Mons Vitals:   03/25/12 1534 03/25/12 1602 03/25/12 1632 03/25/12 1710  BP:  139/81 127/54 142/82  Pulse: 91 97 96 89  Temp:   98.7 F (37.1 C)   TempSrc:   Oral   Resp:  16 17 19   Height:      Weight:         FHT:  FHR: 130 bpm, variability: moderate,  accelerations:  Present,  decelerations:  Absent UC:   regular, every 2-3 minutes, MVU 190-250 SVE:   Dilation: 5.5 Effacement (%): 100 Station: 0 Exam by:: D Paul CNM   A / P: Induction of labor due to elective,  progressing well on pitocin  Fetal Wellbeing:  Category I Pain Control:  Epidural  Anticipated MOD:  Attempt NSVB, suspect macrosomia Exaggerated lateral positioning side to site q 30 min to facilitate optimal vertex positioning.   PAUL,DANIELA 03/25/2012, 5:30 PM

## 2012-03-25 NOTE — Progress Notes (Signed)
Notified tried to call earlier, left voicemail, epidural placed, will recheck cervix.

## 2012-03-25 NOTE — Anesthesia Preprocedure Evaluation (Signed)
Anesthesia Evaluation  Patient identified by MRN, date of birth, ID band Patient awake    Reviewed: Allergy & Precautions, H&P , NPO status , Patient's Chart, lab work & pertinent test results, reviewed documented beta blocker date and time   History of Anesthesia Complications Negative for: history of anesthetic complications  Airway Mallampati: I TM Distance: >3 FB Neck ROM: full    Dental  (+) Teeth Intact   Pulmonary neg pulmonary ROS,  breath sounds clear to auscultation        Cardiovascular negative cardio ROS  Rhythm:regular Rate:Normal     Neuro/Psych  Headaches (migraines), negative psych ROS   GI/Hepatic negative GI ROS, Neg liver ROS,   Endo/Other  Morbid obesity  Renal/GU negative Renal ROS     Musculoskeletal   Abdominal   Peds  Hematology negative hematology ROS (+)   Anesthesia Other Findings   Reproductive/Obstetrics (+) Pregnancy                           Anesthesia Physical Anesthesia Plan  ASA: III  Anesthesia Plan: Epidural   Post-op Pain Management:    Induction:   Airway Management Planned:   Additional Equipment:   Intra-op Plan:   Post-operative Plan:   Informed Consent: I have reviewed the patients History and Physical, chart, labs and discussed the procedure including the risks, benefits and alternatives for the proposed anesthesia with the patient or authorized representative who has indicated his/her understanding and acceptance.     Plan Discussed with:   Anesthesia Plan Comments:         Anesthesia Quick Evaluation

## 2012-03-25 NOTE — Anesthesia Procedure Notes (Signed)
Epidural Patient location during procedure: OB Start time: 03/25/2012 2:52 PM Reason for block: procedure for pain  Staffing Performed by: anesthesiologist   Preanesthetic Checklist Completed: patient identified, site marked, surgical consent, pre-op evaluation, timeout performed, IV checked, risks and benefits discussed and monitors and equipment checked  Epidural Patient position: sitting Prep: site prepped and draped and DuraPrep Patient monitoring: continuous pulse ox and blood pressure Approach: midline Injection technique: LOR air  Needle:  Needle type: Tuohy  Needle gauge: 17 G Needle length: 9 cm Needle insertion depth: 8 cm Catheter type: closed end flexible Catheter size: 19 Gauge Catheter at skin depth: 14 cm Test dose: negative  Assessment Events: blood not aspirated, injection not painful, no injection resistance, negative IV test and no paresthesia  Additional Notes Discussed risk of headache, infection, bleeding, nerve injury and failed or incomplete block.  Patient voices understanding and wishes to proceed.

## 2012-03-25 NOTE — Progress Notes (Signed)
Yesenia Villegas is a 31 y.o. G1P0000 at [redacted]w[redacted]d by LMP admitted for induction of labor due to Elective at term.  Subjective: + cramping, on Pitocin, mother supportive at [facility].  Objective: Filed Vitals:   03/25/12 1147 03/25/12 1156 03/25/12 1250 03/25/12 1327  BP: 131/79  129/73 131/78  Pulse: 97  91 90  Temp: 98.4 F (36.9 C)   97.9 F (36.6 C)  TempSrc: Oral   Oral  Resp:   17 18  Height:  6\' 1"  (1.854 m)    Weight:  154.223 kg (340 lb)        FHT:  FHR: 140 bpm, variability: moderate,  accelerations:  Present,  decelerations:  Present mild variable (nadir 120) and late recovery to BL x 2 UC:   irregular, every 6-10 minutes SVE:   Dilation: 4.5 Effacement (%): 90 Station: -1 Exam by:: D Jovi Zavadil CNM AROM cl AF, VTX IUPC placed Labs:   Basename 03/25/12 1150  WBC 11.4*  HGB 11.5*  HCT 35.5*  PLT 241    Assessment / Plan: Induction of labor due to elective,  progressing well on pitocin  Labor: Progressing normally and IUPC placed for effective Pitocin mgmt. Preeclampsia:  no signs or symptoms of toxicity and labs stable Fetal Wellbeing:  Category I and Category II Pain Control:  Epidural planned in active labor I/D:  PCN per protocol Anticipated MOD:  cautious attempt at NVB, suspect macrosomia  Gunda Maqueda 03/25/2012, 1:33 PM

## 2012-03-25 NOTE — H&P (Signed)
OB ADMISSION/ HISTORY & PHYSICAL:  Admission Date: 03/25/2012 11:01 AM  Admit Diagnosis: Term pregnancy, suspect macrosomia  Yesenia Villegas is a 31 y.o. female presenting for elective IOL.  Prenatal History: G1P0000   EDC : 04/01/2012, Alternate EDD Entry  Prenatal care at [facility] since 1st trimester.  Prenatal course complicated by recurrent yeast vaginosis, migraines, increased maternal weight gain (BMI 40), LGA (4000 gm at 37 wks),  GBS bacteriuria, ear infection No GDM  Prenatal Labs: ABO, Rh:   B pos Antibody: Negative (07/22 0000) Rubella: Immune (07/22 0000)  RPR: Nonreactive (07/22 0000)  HBsAg: Negative (07/22 0000)  HIV: Non-reactive (07/22 0000)  GBS: Positive (07/22 0000)  16 wks 1 hr Glucola : 127. BS monitoring intermittently wnl. Quad screen neg 18 wks sono nl female anatomy, R anterolateral placenta 37 wks sono EFW 4000 gm, symmetric, suspect LGA constitutional / both parents very tall.   Medical / Surgical History :  Past medical history:  Past Medical History  Diagnosis Date  . Migraines   . Ear infection   . Normal labor 03/25/2012     Past surgical history:  Past Surgical History  Procedure Date  . Leep      Family History: History reviewed. No pertinent family history.   Social History:  reports that she has never smoked. She has never used smokeless tobacco. She reports that she drinks alcohol. She reports that she does not use illicit drugs.   Allergies: Percocet    Current Medications at time of admission:  Prescriptions prior to admission  Medication Sig Dispense Refill  . acetaminophen (TYLENOL) 500 MG tablet Take 1,000 mg by mouth every 6 (six) hours as needed. For pain      . ciprofloxacin-dexamethasone (CIPRODEX) otic suspension Place 4 drops into both ears every 6 (six) hours.       Marland Kitchen OVER THE COUNTER MEDICATION Take 1 tablet by mouth every morning. BLUE COHOSH, OTC herbal supplement      . Prenatal  Vit-Fe Fumarate-FA (PRENATAL MULTIVITAMIN) TABS Take 1 tablet by mouth at bedtime.      Marland Kitchen terconazole (TERAZOL 7) 0.4 % vaginal cream Place 1 applicator vaginally daily as needed. Inserts vaginally & uses externally as needed for irritation      . traMADol (ULTRAM) 50 MG tablet Take 100 mg by mouth every 6 (six) hours as needed. For pain      . BLACK COHOSH PO Take 1 tablet by mouth daily.          Review of Systems: Cramping + for past week. Swelling in LE's Denies HA / NV / epigastric pain Good FM, no LOF / VB  Physical Exam:  Dilation: 4.5 Effacement (%): 90 Station: -1 Exam by:: D Harley Fitzwater CNM Filed Vitals:   03/25/12 1327  BP: 131/78  Pulse: 90  Temp: 97.9 F (36.6 C)  Resp: 18    General: AAO x 3, NAD, mild anxiety Heart: RRR Lungs: CTAB Abdomen: soft, NT, S>D, obese Extremities: +2 pedal edema Genitalia / VE: 4/80/-1 FHR: 140, mod variability, occ small variables w/ slow return to baseline TOCO: irregular, mild  Labs:     Basename 03/25/12 1150  WBC 11.4*  HGB 11.5*  HCT 35.5*  PLT 241  .l   Assessment:  31 y.o. G1P0000 at [redacted]w[redacted]d, LGA  1. Favorable cervix for IOL 2. Fetal Wellbeing: Category 1-2  3. Pain Control: desires epidural 4. GBS: positive   Plan:  1. Admit to BS 2. Routine  L&D orders, Pitocin protocol 3. Analgesia/anesthesia PRN  4. GBS prophylaxis - PCN  Consultant: Dr. Bradd Canary 03/25/2012, 1:32 PM

## 2012-03-26 ENCOUNTER — Encounter (HOSPITAL_COMMUNITY): Payer: Self-pay | Admitting: Obstetrics

## 2012-03-26 LAB — CBC
Platelets: 214 10*3/uL (ref 150–400)
RBC: 3.6 MIL/uL — ABNORMAL LOW (ref 3.87–5.11)
WBC: 15 10*3/uL — ABNORMAL HIGH (ref 4.0–10.5)

## 2012-03-26 NOTE — Anesthesia Postprocedure Evaluation (Signed)
  Anesthesia Post-op Note  Patient: Yesenia Villegas  Procedure(s) Performed: * No procedures listed *  Patient Location: PACU and Mother/Baby  Anesthesia Type: Epidural  Level of Consciousness: awake, alert  and oriented  Airway and Oxygen Therapy: Patient Spontanous Breathing  Post-op Pain: none  Post-op Assessment: Post-op Vital signs reviewed and Patient's Cardiovascular Status Stable  Post-op Vital Signs: Reviewed and stable  Complications: No apparent anesthesia complications

## 2012-03-26 NOTE — Progress Notes (Signed)
PPD 1 SVD  S:  Reports feeling well, sore perineum.             Tolerating po/ No nausea or vomiting             Bleeding is light             Pain controlled with Motrin             Up ad lib / ambulatory / voiding well.   Newborn  Information for the patient's newborn:  Nimrit, Kehres Foster [[id number]]  female  breast feeding, difficulty latching, since LC helped in birth room last night / Circumcision planned tomorrow   O:  A & O x 3 NAD             VS:  Filed Vitals:   03/25/12 2240 03/25/12 2300 03/26/12 0005 03/26/12 0405  BP: 145/72 132/83 138/78 134/82  Pulse: 106 108 102 103  Temp:  98.4 F (36.9 C) 98.9 F (37.2 C) 98.3 F (36.8 C)  TempSrc:  Oral Oral Oral  Resp:  18 18 18   Height:      Weight:        LABS:  Basename 03/26/12 0525 03/25/12 1150  WBC 15.0* 11.4*  HGB 10.7* 11.5*  HCT 32.4* 35.5*  PLT 214 241    Blood type: --/--/B POS, B POS (07/22 1150)  Rubella: Immune (07/22 0000)   I&O: I/O last 3 completed shifts: In: -  Out: 1850 [Urine:1400; Blood:450]      Lungs: Clear and unlabored  Heart: regular rate and rhythm / no murmurs  Abdomen: soft, non-tender, non-distended, obese             Fundus: firm, non-tender, U -1  Perineum: repair intact, mild edema  Lochia: small  Extremities: trace pedal edema, no calf pain or tenderness, neg Homans    A/P: PPD # 1 30 y.o., G1P1001    Principal Problem:  *Postpartum care following vaginal delivery (7/22)   Doing well - stable status  Routine post partum orders  [facility] consult today for latch  Anticipate discharge home in AM.   Gurney Balthazor, CNM, MSN 03/26/2012, 8:51 AM

## 2012-03-27 MED ORDER — IBUPROFEN 600 MG PO TABS: 600.0000 mg | ORAL_TABLET | Freq: Four times a day (QID) | ORAL | Status: AC

## 2012-03-27 NOTE — Discharge Summary (Signed)
Obstetric Discharge Summary  Reason for Admission: induction of labor Prenatal Procedures: ultrasound Intrapartum Procedures: spontaneous vaginal delivery / GBS prophylaxis Postpartum Procedures: none Complications-Operative and Postpartum: 1st  degree perineal laceration Hemoglobin  Date Value Range Status  03/26/2012 10.7* 12.0 - 15.0 g/dL Final     HCT  Date Value Range Status  03/26/2012 32.4* 36.0 - 46.0 % Final    Physical Exam:  General: alert, cooperative and no distress Lochia: appropriate Uterine Fundus: firm Incision: healing well DVT Evaluation: No evidence of DVT seen on physical exam.  Discharge Diagnoses: Term Pregnancy-delivered  Discharge Information: Date: 03/27/2012 Activity: pelvic rest Diet: routine Medications: PNV, Ibuprofen and ultram Condition: stable Instructions: refer to practice specific booklet Discharge to: home Follow-up Information    Follow up with [facility], CNM. Schedule an appointment as soon as possible for a visit in 6 weeks.   Contact information:   [address] [address] [phone]          Newborn Data: Live born female  Birth Weight: 8 lb 12.9 oz (3995 g) APGAR: 8, 9  Home with mother.  Marlinda Mike 03/27/2012, 8:53 AM

## 2012-03-27 NOTE — Progress Notes (Signed)
PPD 2 SVD  S:  Reports feeling well- ready to go home             Tolerating po/ No nausea or vomiting             Bleeding is light             Pain controlled with motrin only             Up ad lib / ambulatory  Newborn breast & bottle feeding  / Circumcision today prior to discharge ( Dr Juliene Pina)   [place]:  A & O x 3 NAD             VS: Blood pressure 117/75, pulse 80, temperature 97.8 F (36.6 C), temperature source Oral, resp. rate 18, height 6\' 1"  (1.854 m), weight 154.223 kg (340 lb), unknown if currently breastfeeding.  Abdomen: soft, non-tender, non-distended              Fundus: firm, non-tender, U-1  Perineum: mild edema  Lochia: light  Extremities: no edema, no calf pain or tenderness   A: PPD # 2   Doing well - stable status  P:  Routine post partum orders  Discharge home  Marlinda Mike CNM, MSN 03/27/2012, 8:50 AM

## 2012-03-27 NOTE — Discharge Summary (Signed)
Reviewed and agree with note --v.Juliene Pina, MD

## 2012-03-28 LAB — TYPE AND SCREEN
ABO/RH(D): B POS
Unit division: 0

## 2013-04-05 ENCOUNTER — Encounter (HOSPITAL_COMMUNITY): Payer: Self-pay | Admitting: *Deleted

## 2013-04-05 ENCOUNTER — Emergency Department (HOSPITAL_COMMUNITY)
Admission: EM | Admit: 2013-04-05 | Discharge: 2013-04-05 | Disposition: A | Discharge status: Home / Self Care | Payer: BC Managed Care – PPO | Attending: Emergency Medicine | Admitting: Emergency Medicine

## 2013-04-05 DIAGNOSIS — J3489 Other specified disorders of nose and nasal sinuses: Secondary | ICD-10-CM | POA: Insufficient documentation

## 2013-04-05 DIAGNOSIS — J069 Acute upper respiratory infection, unspecified: Secondary | ICD-10-CM | POA: Insufficient documentation

## 2013-04-05 DIAGNOSIS — J351 Hypertrophy of tonsils: Secondary | ICD-10-CM | POA: Insufficient documentation

## 2013-04-05 DIAGNOSIS — R0982 Postnasal drip: Secondary | ICD-10-CM | POA: Insufficient documentation

## 2013-04-05 DIAGNOSIS — J029 Acute pharyngitis, unspecified: Secondary | ICD-10-CM | POA: Insufficient documentation

## 2013-04-05 MED ORDER — FLUTICASONE PROPIONATE 50 MCG/ACT NA SUSP: 2.0000 | Freq: Every day | NASAL | Status: AC

## 2013-04-05 NOTE — ED Provider Notes (Signed)
Medical screening examination/treatment/procedure(s) were performed by non-physician practitioner and as supervising physician I was immediately available for consultation/collaboration.   Charles B. Bernette Mayers, MD 04/05/13 1510

## 2013-04-05 NOTE — ED Provider Notes (Signed)
CSN: [id number]     Arrival date & time 04/05/13  1350 History     First MD Initiated Contact with Patient 04/05/13 1424     Chief Complaint  Patient presents with  . URI  . Cough  . Chest Pain   (Consider location/radiation/quality/duration/timing/severity/associated sxs/prior Treatment) HPI Comments: 32 y/o female presents to the ED complaining of continuing cough and cold symptoms x 2 weeks. She was seen by her PCP about 1 week ago and given amoxicillin for sinusitis. Her sinusitis symptoms have since resolved, however now has chest congestion and continuing dry cough. Tried taking mucinex without relief. Despite triage summary, patient denies chest pain and states it is congestion. Admits to associated sore throat and congestion. Denies fever, chills, shortness of breath, nausea, vomiting. No sick contacts, she works from home.  Patient is a 32 y.o. female presenting with URI, cough, and chest pain. The history is provided by the patient.  URI Presenting symptoms: congestion, cough and sore throat   Presenting symptoms: no fever   Associated symptoms: no wheezing   Cough Associated symptoms: sore throat   Associated symptoms: no chest pain, no chills, no fever, no shortness of breath and no wheezing   Chest Pain Associated symptoms: cough   Associated symptoms: no fever and no shortness of breath     Past Medical History  Diagnosis Date  . Migraines   . Ear infection   . Normal labor 03/25/2012  . Postpartum care following vaginal delivery (7/22) 03/25/2012   Past Surgical History  Procedure Laterality Date  . Leep     History reviewed. No pertinent family history. History  Substance Use Topics  . Smoking status: Never Smoker   . Smokeless tobacco: Never Used  . Alcohol Use: Yes     Comment: rarely   OB History   Grav Para Term Preterm Abortions TAB SAB Ect Mult Living   1 1 1  0 0 0 0 0 0 1     Review of Systems  Constitutional: Negative for fever and chills.   HENT: Positive for congestion, sore throat and postnasal drip.   Respiratory: Positive for cough. Negative for shortness of breath and wheezing.   Cardiovascular: Negative for chest pain.  All other systems reviewed and are negative.    Allergies  Percocet  Home Medications   Current Outpatient Rx  Name  Route  Sig  Dispense  Refill  . amoxicillin (AMOXIL) 500 MG tablet   Oral   Take 500 mg by mouth 2 (two) times daily.         Marland Kitchen guaiFENesin (MUCINEX) 600 MG 12 hr tablet   Oral   Take 1,200 mg by mouth 2 (two) times daily as needed for congestion.          BP 138/88  Pulse 67  Temp(Src) 98.1 F (36.7 C) (Oral)  Resp 14  Wt 293 lb 4.8 oz (133.04 kg)  BMI 38.7 kg/m2  SpO2 100%  LMP 03/07/2013 Physical Exam  Nursing note and vitals reviewed. Constitutional: She is oriented to person, place, and time. She appears well-developed and well-nourished. No distress.  HENT:  Head: Normocephalic and atraumatic.  Right Ear: Tympanic membrane normal.  Left Ear: Tympanic membrane normal.  Nose: Mucosal edema present.  Mouth/Throat: Mucous membranes are normal. No oropharyngeal exudate.  Tonsils enlarged +3 bilateral without exudate. Post nasal drip noted.  Eyes: Conjunctivae are normal.  Neck: Normal range of motion. Neck supple.  Cardiovascular: Normal rate, regular rhythm and normal  heart sounds.   Pulmonary/Chest: Effort normal and breath sounds normal. She has no decreased breath sounds. She has no wheezes. She has no rhonchi. She has no rales.  Abdominal: Soft. Bowel sounds are normal. There is no tenderness.  Musculoskeletal: Normal range of motion. She exhibits no edema.  Neurological: She is alert and oriented to person, place, and time.  Skin: Skin is warm and dry. She is not diaphoretic.  Psychiatric: She has a normal mood and affect. Her behavior is normal.    ED Course   Procedures (including critical care time)  Labs Reviewed - No data to display No  results found. 1. URI (upper respiratory infection)     MDM  Patient with URI s/s. I discussed viral illnesses in detail. Rx for flonase, advised nasal saline rinses, salt-water gargles, robitussin for cough. Normal vitals, NAD. Return precautions discussed. Patient states understanding of plan and is agreeable.   Trevor Mace, PA-C 04/05/13 1504

## 2013-04-05 NOTE — ED Notes (Signed)
Pt reports that she has had a cough and cold symptoms for about [redacted] weeks along with chest pain.  The chest pain caries from dull to sharp with no precipitating factors.  No fevers.  She has been taking mucinex for about a week with no relief from her symptoms. She was seen recently and told that she has a sinus infection and was placed on amoxicillin.

## 2013-07-29 ENCOUNTER — Other Ambulatory Visit (HOSPITAL_COMMUNITY)
Admission: RE | Admit: 2013-07-29 | Discharge: 2013-07-29 | Disposition: A | Discharge status: Home / Self Care | Payer: BC Managed Care – PPO | Source: Ambulatory Visit | Attending: Family Medicine | Admitting: Family Medicine

## 2013-07-29 ENCOUNTER — Other Ambulatory Visit: Payer: Self-pay

## 2013-07-29 DIAGNOSIS — Z01419 Encounter for gynecological examination (general) (routine) without abnormal findings: Secondary | ICD-10-CM | POA: Insufficient documentation

## 2014-07-06 ENCOUNTER — Encounter (HOSPITAL_COMMUNITY): Payer: Self-pay | Admitting: *Deleted

## 2014-10-09 ENCOUNTER — Ambulatory Visit (INDEPENDENT_AMBULATORY_CARE_PROVIDER_SITE_OTHER): Payer: BLUE CROSS/BLUE SHIELD | Admitting: Psychology

## 2014-10-09 DIAGNOSIS — F900 Attention-deficit hyperactivity disorder, predominantly inattentive type: Secondary | ICD-10-CM

## 2014-10-15 ENCOUNTER — Encounter (HOSPITAL_COMMUNITY): Payer: Self-pay | Admitting: Emergency Medicine

## 2014-10-15 ENCOUNTER — Emergency Department (HOSPITAL_COMMUNITY)
Admission: EM | Admit: 2014-10-15 | Discharge: 2014-10-15 | Disposition: A | Discharge status: Home / Self Care | Payer: BLUE CROSS/BLUE SHIELD | Attending: Emergency Medicine | Admitting: Emergency Medicine

## 2014-10-15 ENCOUNTER — Emergency Department (HOSPITAL_COMMUNITY): Payer: BLUE CROSS/BLUE SHIELD

## 2014-10-15 DIAGNOSIS — G43909 Migraine, unspecified, not intractable, without status migrainosus: Secondary | ICD-10-CM | POA: Diagnosis present

## 2014-10-15 DIAGNOSIS — Z7951 Long term (current) use of inhaled steroids: Secondary | ICD-10-CM | POA: Diagnosis not present

## 2014-10-15 DIAGNOSIS — G43109 Migraine with aura, not intractable, without status migrainosus: Secondary | ICD-10-CM | POA: Diagnosis not present

## 2014-10-15 DIAGNOSIS — R51 Headache: Secondary | ICD-10-CM

## 2014-10-15 DIAGNOSIS — R519 Headache, unspecified: Secondary | ICD-10-CM

## 2014-10-15 LAB — CBC WITH DIFFERENTIAL/PLATELET
BASOS PCT: 0 % (ref 0–1)
Basophils Absolute: 0 10*3/uL (ref 0.0–0.1)
EOS ABS: 0.2 10*3/uL (ref 0.0–0.7)
EOS PCT: 3 % (ref 0–5)
HCT: 43.1 % (ref 36.0–46.0)
HEMOGLOBIN: 13.7 g/dL (ref 12.0–15.0)
LYMPHS ABS: 1.4 10*3/uL (ref 0.7–4.0)
Lymphocytes Relative: 21 % (ref 12–46)
MCH: 29 pg (ref 26.0–34.0)
MCHC: 31.8 g/dL (ref 30.0–36.0)
MCV: 91.3 fL (ref 78.0–100.0)
MONO ABS: 0.5 10*3/uL (ref 0.1–1.0)
MONOS PCT: 7 % (ref 3–12)
NEUTROS PCT: 69 % (ref 43–77)
Neutro Abs: 4.7 10*3/uL (ref 1.7–7.7)
Platelets: 322 10*3/uL (ref 150–400)
RBC: 4.72 MIL/uL (ref 3.87–5.11)
RDW: 12.7 % (ref 11.5–15.5)
WBC: 6.8 10*3/uL (ref 4.0–10.5)

## 2014-10-15 LAB — BASIC METABOLIC PANEL
ANION GAP: 6 (ref 5–15)
BUN: 10 mg/dL (ref 6–23)
CALCIUM: 9.2 mg/dL (ref 8.4–10.5)
CO2: 28 mmol/L (ref 19–32)
CREATININE: 0.86 mg/dL (ref 0.50–1.10)
Chloride: 106 mmol/L (ref 96–112)
GFR, EST NON AFRICAN AMERICAN: 88 mL/min — AB (ref >90)
Glucose, Bld: 87 mg/dL (ref 70–99)
Potassium: 4.2 mmol/L (ref 3.5–5.1)
SODIUM: 140 mmol/L (ref 135–145)

## 2014-10-15 MED ORDER — DIPHENHYDRAMINE HCL 50 MG/ML IJ SOLN
25.0000 mg | Freq: Once | INTRAMUSCULAR | Status: AC
  Administered 2014-10-15: 25 mg via INTRAVENOUS
  Filled 2014-10-15: qty 1

## 2014-10-15 MED ORDER — DEXAMETHASONE SODIUM PHOSPHATE 10 MG/ML IJ SOLN
10.0000 mg | Freq: Once | INTRAMUSCULAR | Status: AC
  Administered 2014-10-15: 10 mg via INTRAVENOUS
  Filled 2014-10-15: qty 1

## 2014-10-15 MED ORDER — SODIUM CHLORIDE 0.9 % IV SOLN
INTRAVENOUS | Status: DC
  Administered 2014-10-15: 11:00:00 via INTRAVENOUS

## 2014-10-15 MED ORDER — SODIUM CHLORIDE 0.9 % IV BOLUS (SEPSIS)
1000.0000 mL | Freq: Once | INTRAVENOUS | Status: AC
  Administered 2014-10-15: 1000 mL via INTRAVENOUS

## 2014-10-15 MED ORDER — PROMETHAZINE HCL 25 MG/ML IJ SOLN
12.5000 mg | Freq: Once | INTRAMUSCULAR | Status: AC
  Administered 2014-10-15: 12.5 mg via INTRAVENOUS
  Filled 2014-10-15: qty 1

## 2014-10-15 NOTE — ED Provider Notes (Signed)
CSN: [id number]     Arrival date & time 10/15/14  4098 History   First MD Initiated Contact with Patient 10/15/14 [id number]     Chief Complaint  Patient presents with  . Migraine     (Consider location/radiation/quality/duration/timing/severity/associated sxs/prior Treatment) Patient is a 34 y.o. female presenting with migraines. The history is provided by the patient.  Migraine Associated symptoms include headaches. Pertinent negatives include no chest pain, no abdominal pain and no shortness of breath.   patient with history of migraines. Patient with onset of migraine headache on Monday. Persisting not resolving with 800 mg of Motrin. Started left temporal area which is typical for her radiate to the back of the head. Now with some right temporal pain. Associated with nausea but no vomiting photophobia and pain behind the eyes. These are typical for her. What is atypical as she had left arm numbness yesterday that lasted for 30 minutes. And had slurred speech on and off yesterday. The speech was most likely to be slurred when the pain was at its greatest. Patient followed by [facility] physicians but not able to get an appointment.  Past Medical History  Diagnosis Date  . Migraines   . Ear infection   . Normal labor 03/25/2012  . Postpartum care following vaginal delivery (7/22) 03/25/2012   Past Surgical History  Procedure Laterality Date  . Leep     History reviewed. No pertinent family history. History  Substance Use Topics  . Smoking status: Never Smoker   . Smokeless tobacco: Never Used  . Alcohol Use: Yes     Comment: rarely   OB History    Gravida Para Term Preterm AB TAB SAB Ectopic Multiple Living   0 0 0 0 0 0 1     Review of Systems  Constitutional: Negative for fever.  HENT: Negative for congestion.   Eyes: Positive for photophobia.  Respiratory: Negative for shortness of breath.   Cardiovascular: Negative for chest pain.  Gastrointestinal: Positive for nausea.  Negative for vomiting and abdominal pain.  Musculoskeletal: Negative for back pain and neck pain.  Skin: Negative for rash.  Neurological: Positive for speech difficulty, numbness and headaches.  Hematological: Does not bruise/bleed easily.  Psychiatric/Behavioral: Negative for confusion.      Allergies  Percocet  Home Medications   Prior to Admission medications   Medication Sig Start Date End Date Taking? Authorizing Provider  fluticasone (FLONASE) 50 MCG/ACT nasal spray Place 2 sprays into the nose daily. Patient taking differently: Place 2 sprays into the nose daily as needed for allergies.  04/05/13  Yes Robyn M Hess, PA-C  ibuprofen (ADVIL,MOTRIN) 800 MG tablet Take 800 mg by mouth every 8 (eight) hours as needed for headache.   Yes Historical Provider, MD   BP 129/87 mmHg  Pulse 73  Temp(Src) 98.2 F (36.8 C) (Oral)  Resp 16  SpO2 98%  LMP 10/03/2014 Physical Exam  Constitutional: She is oriented to person, place, and time. She appears well-developed and well-nourished. No distress.  HENT:  Head: Normocephalic and atraumatic.  Mouth/Throat: Oropharynx is clear and moist.  Eyes: Conjunctivae and EOM are normal. Pupils are equal, round, and reactive to light.  Neck: Normal range of motion. Neck supple.  Cardiovascular: Normal rate, regular rhythm and normal heart sounds.   No murmur heard. Pulmonary/Chest: Effort normal and breath sounds normal. No respiratory distress.  Abdominal: Bowel sounds are normal. There is no tenderness.  Musculoskeletal: Normal range of motion.  Neurological: She is  alert and oriented to person, place, and time. No cranial nerve deficit. She exhibits normal muscle tone. Coordination normal.  Skin: Skin is warm. No rash noted.  Nursing note and vitals reviewed.   ED Course  Procedures (including critical care time) Labs Review Labs Reviewed  BASIC METABOLIC PANEL - Abnormal; Notable for the following:    GFR calc non Af Amer 88 (*)     All other components within normal limits  CBC WITH DIFFERENTIAL/PLATELET    Imaging Review Ct Head Wo Contrast  10/15/2014   CLINICAL DATA:  Pounding migraine-type headache arising from left temporal region and extending posteriorly  EXAM: CT HEAD WITHOUT CONTRAST  TECHNIQUE: Contiguous axial images were obtained from the base of the skull through the vertex without intravenous contrast.  COMPARISON:  None.  FINDINGS: The ventricles are normal in size and configuration. There is no mass, hemorrhage, extra-axial fluid collection, or midline shift. Gray-white compartments appear normal. There is no demonstrable acute infarct. The bony calvarium appears intact. The visualized mastoid air cells are clear.  IMPRESSION: Study within normal limits. No intracranial mass, hemorrhage, or focal gray -white compartment lesion.   Electronically Signed   By: Bretta BangWilliam  Woodruff III M.D.   On: 10/15/2014 12:42     EKG Interpretation None      MDM   Final diagnoses:  Headache  Complicated migraine    Symptoms most likely, consistent with a complicated migraine. Head CT will be done since the first time with neurological symptoms associated with her migraine. Patient treated with migraine cocktail and IV fluids. Patient received Decadron Benadryl and Phenergan.  Head CT negative. Patient without any neuro deficits today. Suspect this is a complicated migraine. Patient improving with migraine cocktail. Precautions provided to patient home when to return.    Vanetta MuldersScott Syriana Croslin, MD 10/15/14 1313

## 2014-10-15 NOTE — Discharge Instructions (Signed)
Head CT was negative. Medications here should continue to work and help resolve the headache over the next 24 hours. Go home and rest. Work note provided. Return for any new or worse symptoms.

## 2014-10-15 NOTE — ED Notes (Addendum)
Pt complaint of migraine starting Monday. Throbbing, primarily left sided temporal radiating to back of head. Pt states it's been making her feel weak, occasionally been having slurred speech, and she states she had numbness down her left arm and in fingers yesterday for about 30 min. Hx of migraines

## 2014-10-15 NOTE — ED Notes (Signed)
Pt informed she cannot drive after administration of phenergan and benadryl; pt states she will call someone to drive her home

## 2014-11-06 ENCOUNTER — Ambulatory Visit (INDEPENDENT_AMBULATORY_CARE_PROVIDER_SITE_OTHER): Payer: BLUE CROSS/BLUE SHIELD | Admitting: Psychology

## 2014-11-06 DIAGNOSIS — F411 Generalized anxiety disorder: Secondary | ICD-10-CM

## 2014-11-06 DIAGNOSIS — F341 Dysthymic disorder: Secondary | ICD-10-CM | POA: Diagnosis not present

## 2014-12-04 ENCOUNTER — Emergency Department (HOSPITAL_BASED_OUTPATIENT_CLINIC_OR_DEPARTMENT_OTHER)
Admission: EM | Admit: 2014-12-04 | Discharge: 2014-12-04 | Disposition: A | Discharge status: Home / Self Care | Payer: BLUE CROSS/BLUE SHIELD | Attending: Emergency Medicine | Admitting: Emergency Medicine

## 2014-12-04 ENCOUNTER — Emergency Department (HOSPITAL_BASED_OUTPATIENT_CLINIC_OR_DEPARTMENT_OTHER): Payer: BLUE CROSS/BLUE SHIELD

## 2014-12-04 ENCOUNTER — Encounter (HOSPITAL_BASED_OUTPATIENT_CLINIC_OR_DEPARTMENT_OTHER): Payer: Self-pay | Admitting: *Deleted

## 2014-12-04 DIAGNOSIS — Z8679 Personal history of other diseases of the circulatory system: Secondary | ICD-10-CM | POA: Diagnosis not present

## 2014-12-04 DIAGNOSIS — R1011 Right upper quadrant pain: Secondary | ICD-10-CM

## 2014-12-04 DIAGNOSIS — Z7951 Long term (current) use of inhaled steroids: Secondary | ICD-10-CM | POA: Insufficient documentation

## 2014-12-04 DIAGNOSIS — Z8669 Personal history of other diseases of the nervous system and sense organs: Secondary | ICD-10-CM | POA: Diagnosis not present

## 2014-12-04 DIAGNOSIS — R079 Chest pain, unspecified: Secondary | ICD-10-CM | POA: Diagnosis present

## 2014-12-04 DIAGNOSIS — R52 Pain, unspecified: Secondary | ICD-10-CM

## 2014-12-04 LAB — COMPREHENSIVE METABOLIC PANEL
ALBUMIN: 4.1 g/dL (ref 3.5–5.2)
ALK PHOS: 57 U/L (ref 39–117)
ALT: 22 U/L (ref 0–35)
ANION GAP: 9 (ref 5–15)
AST: 21 U/L (ref 0–37)
BUN: 13 mg/dL (ref 6–23)
CHLORIDE: 101 mmol/L (ref 96–112)
CO2: 28 mmol/L (ref 19–32)
Calcium: 9.9 mg/dL (ref 8.4–10.5)
Creatinine, Ser: 0.84 mg/dL (ref 0.50–1.10)
GFR calc Af Amer: >90 mL/min (ref >90)
GFR calc non Af Amer: >90 mL/min (ref >90)
Glucose, Bld: 91 mg/dL (ref 70–99)
Potassium: 3.6 mmol/L (ref 3.5–5.1)
Sodium: 138 mmol/L (ref 135–145)
TOTAL PROTEIN: 8.7 g/dL — AB (ref 6.0–8.3)
Total Bilirubin: 0.2 mg/dL — ABNORMAL LOW (ref 0.3–1.2)

## 2014-12-04 LAB — CBC WITH DIFFERENTIAL/PLATELET
Basophils Absolute: 0.1 10*3/uL (ref 0.0–0.1)
Basophils Relative: 1 % (ref 0–1)
Eosinophils Absolute: 0.2 10*3/uL (ref 0.0–0.7)
Eosinophils Relative: 2 % (ref 0–5)
HEMATOCRIT: 42.7 % (ref 36.0–46.0)
Hemoglobin: 14 g/dL (ref 12.0–15.0)
LYMPHS PCT: 28 % (ref 12–46)
Lymphs Abs: 2.1 10*3/uL (ref 0.7–4.0)
MCH: 29.2 pg (ref 26.0–34.0)
MCHC: 32.8 g/dL (ref 30.0–36.0)
MCV: 89.1 fL (ref 78.0–100.0)
MONO ABS: 0.7 10*3/uL (ref 0.1–1.0)
MONOS PCT: 10 % (ref 3–12)
NEUTROS ABS: 4.4 10*3/uL (ref 1.7–7.7)
Neutrophils Relative %: 59 % (ref 43–77)
Platelets: 318 10*3/uL (ref 150–400)
RBC: 4.79 MIL/uL (ref 3.87–5.11)
RDW: 12.9 % (ref 11.5–15.5)
WBC: 7.4 10*3/uL (ref 4.0–10.5)

## 2014-12-04 LAB — URINALYSIS, ROUTINE W REFLEX MICROSCOPIC
BILIRUBIN URINE: NEGATIVE
Glucose, UA: NEGATIVE mg/dL
KETONES UR: NEGATIVE mg/dL
NITRITE: NEGATIVE
PH: 5.5 (ref 5.0–8.0)
Protein, ur: NEGATIVE mg/dL
SPECIFIC GRAVITY, URINE: 1.018 (ref 1.005–1.030)
UROBILINOGEN UA: 0.2 mg/dL (ref 0.0–1.0)

## 2014-12-04 LAB — URINE MICROSCOPIC-ADD ON

## 2014-12-04 LAB — LIPASE, BLOOD: Lipase: 33 U/L (ref 11–59)

## 2014-12-04 LAB — TROPONIN I

## 2014-12-04 MED ORDER — KETOROLAC TROMETHAMINE 30 MG/ML IJ SOLN
30.0000 mg | Freq: Once | INTRAMUSCULAR | Status: DC
  Filled 2014-12-04: qty 1

## 2014-12-04 MED ORDER — OMEPRAZOLE 20 MG PO CPDR: 20.0000 mg | DELAYED_RELEASE_CAPSULE | Freq: Every day | ORAL | Status: AC

## 2014-12-04 MED ORDER — GI COCKTAIL ~~LOC~~
30.0000 mL | Freq: Once | ORAL | Status: AC
  Administered 2014-12-04: 30 mL via ORAL
  Filled 2014-12-04: qty 30

## 2014-12-04 MED ORDER — KETOROLAC TROMETHAMINE 60 MG/2ML IM SOLN
60.0000 mg | Freq: Once | INTRAMUSCULAR | Status: AC
  Administered 2014-12-04: 60 mg via INTRAMUSCULAR
  Filled 2014-12-04: qty 2

## 2014-12-04 MED ORDER — ONDANSETRON 4 MG PO TBDP
4.0000 mg | ORAL_TABLET | Freq: Once | ORAL | Status: AC
  Administered 2014-12-04: 4 mg via ORAL
  Filled 2014-12-04: qty 1

## 2014-12-04 MED ORDER — HYDROCODONE-ACETAMINOPHEN 5-325 MG PO TABS: 2.0000 | ORAL_TABLET | ORAL | Status: AC | PRN

## 2014-12-04 MED ORDER — ONDANSETRON HCL 4 MG/2ML IJ SOLN
4.0000 mg | Freq: Once | INTRAMUSCULAR | Status: DC
  Filled 2014-12-04: qty 2

## 2014-12-04 MED ORDER — PANTOPRAZOLE SODIUM 40 MG PO TBEC
40.0000 mg | DELAYED_RELEASE_TABLET | Freq: Every day | ORAL | Status: DC
  Administered 2014-12-04: 40 mg via ORAL
  Filled 2014-12-04: qty 1

## 2014-12-04 MED ORDER — HYDROMORPHONE HCL 1 MG/ML IJ SOLN
1.0000 mg | Freq: Once | INTRAMUSCULAR | Status: DC
  Filled 2014-12-04: qty 1

## 2014-12-04 NOTE — Discharge Instructions (Signed)
Abdominal Pain °Many things can cause abdominal pain. Usually, abdominal pain is not caused by a disease and will improve without treatment. It can often be observed and treated at home. Your health care provider will do a physical exam and possibly order blood tests and X-rays to help determine the seriousness of your pain. However, in many cases, more time must pass before a clear cause of the pain can be found. Before that point, your health care provider may not know if you need more testing or further treatment. °HOME CARE INSTRUCTIONS  °Monitor your abdominal pain for any changes. The following actions may help to alleviate any discomfort you are experiencing: °· Only take over-the-counter or prescription medicines as directed by your health care provider. °· Do not take laxatives unless directed to do so by your health care provider. °· Try a clear liquid diet (broth, tea, or water) as directed by your health care provider. Slowly move to a bland diet as tolerated. °SEEK MEDICAL CARE IF: °· You have unexplained abdominal pain. °· You have abdominal pain associated with nausea or diarrhea. °· You have pain when you urinate or have a bowel movement. °· You experience abdominal pain that wakes you in the night. °· You have abdominal pain that is worsened or improved by eating food. °· You have abdominal pain that is worsened with eating fatty foods. °· You have a fever. °SEEK IMMEDIATE MEDICAL CARE IF:  °· Your pain does not go away within 2 hours. °· You keep throwing up (vomiting). °· Your pain is felt only in portions of the abdomen, such as the right side or the left lower portion of the abdomen. °· You pass bloody or black tarry stools. °MAKE SURE YOU: °· Understand these instructions.   °· Will watch your condition.   °· Will get help right away if you are not doing well or get worse.   °Document Released: 05/31/2005 Document Revised: 08/26/2013 Document Reviewed: 04/30/2013 °[employer]® Patient Information  ©2015 [employer]. This information is not intended to replace advice given to you by your health care provider. Make sure you discuss any questions you have with your health care provider. ° °Chest Pain (Nonspecific) °It is often hard to give a specific diagnosis for the cause of chest pain. There is always a chance that your pain could be related to something serious, such as a heart attack or a blood clot in the lungs. You need to follow up with your health care provider for further evaluation. °CAUSES  °· Heartburn. °· Pneumonia or bronchitis. °· Anxiety or stress. °· Inflammation around your heart (pericarditis) or lung (pleuritis or pleurisy). °· A blood clot in the lung. °· A collapsed lung (pneumothorax). It can develop suddenly on its own (spontaneous pneumothorax) or from trauma to the chest. °· Shingles infection (herpes zoster virus). °The chest wall is composed of bones, muscles, and cartilage. Any of these can be the source of the pain. °· The bones can be bruised by injury. °· The muscles or cartilage can be strained by coughing or overwork. °· The cartilage can be affected by inflammation and become sore (costochondritis). °DIAGNOSIS  °Lab tests or other studies may be needed to find the cause of your pain. Your health care provider may have you take a test called an ambulatory electrocardiogram (ECG). An ECG records your heartbeat patterns over a 24-hour period. You may also have other tests, such as: °· Transthoracic echocardiogram (TTE). During echocardiography, sound waves are used to evaluate how blood   flows through your heart. °· Transesophageal echocardiogram (TEE). °· Cardiac monitoring. This allows your health care provider to monitor your heart rate and rhythm in real time. °· Holter monitor. This is a portable device that records your heartbeat and can help diagnose heart arrhythmias. It allows your health care provider to track your heart activity for several days, if needed. °· Stress  tests by exercise or by giving medicine that makes the heart beat faster. °TREATMENT  °· Treatment depends on what may be causing your chest pain. Treatment may include: °¨ Acid blockers for heartburn. °¨ Anti-inflammatory medicine. °¨ Pain medicine for inflammatory conditions. °¨ Antibiotics if an infection is present. °· You may be advised to change lifestyle habits. This includes stopping smoking and avoiding alcohol, caffeine, and chocolate. °· You may be advised to keep your head raised (elevated) when sleeping. This reduces the chance of acid going backward from your stomach into your esophagus. °Most of the time, nonspecific chest pain will improve within 2-3 days with rest and mild pain medicine.  °HOME CARE INSTRUCTIONS  °· If antibiotics were prescribed, take them as directed. Finish them even if you start to feel better. °· For the next few days, avoid physical activities that bring on chest pain. Continue physical activities as directed. °· Do not use any tobacco products, including cigarettes, chewing tobacco, or electronic cigarettes. °· Avoid drinking alcohol. °· Only take medicine as directed by your health care provider. °· Follow your health care provider's suggestions for further testing if your chest pain does not go away. °· Keep any follow-up appointments you made. If you do not go to an appointment, you could develop lasting (chronic) problems with pain. If there is any problem keeping an appointment, call to reschedule. °SEEK MEDICAL CARE IF:  °· Your chest pain does not go away, even after treatment. °· You have a rash with blisters on your chest. °· You have a fever. °SEEK IMMEDIATE MEDICAL CARE IF:  °· You have increased chest pain or pain that spreads to your arm, neck, jaw, back, or abdomen. °· You have shortness of breath. °· You have an increasing cough, or you cough up blood. °· You have severe back or abdominal pain. °· You feel nauseous or vomit. °· You have severe weakness. °· You  faint. °· You have chills. °This is an emergency. Do not wait to see if the pain will go away. Get medical help at once. Call your local emergency services (911 in [place] Do not drive yourself to the hospital. °MAKE SURE YOU:  °· Understand these instructions. °· Will watch your condition. °· Will get help right away if you are not doing well or get worse. °Document Released: 05/31/2005 Document Revised: 08/26/2013 Document Reviewed: 03/26/2008 °[employer]® Patient Information ©2015 [employer]. This information is not intended to replace advice given to you by your health care provider. Make sure you discuss any questions you have with your health care provider. ° °

## 2014-12-04 NOTE — ED Notes (Signed)
Pt c/o sudden onset of central chest squeezing that radiates under her right breast at apprx. 4:30 pm today while at work. Pt denies SOB, dizziness, nausea or diaphoresis.

## 2014-12-04 NOTE — ED Provider Notes (Signed)
CSN: [id number]     Arrival date & time 12/04/14  1826 History   First MD Initiated Contact with Patient 12/04/14 1843     Chief Complaint  Patient presents with  . Chest Pain     (Consider location/radiation/quality/duration/timing/severity/associated sxs/prior Treatment) Patient is a 34 y.o. female presenting with chest pain. The history is provided by the patient. No language interpreter was used.  Chest Pain Pain location:  Substernal area and epigastric Pain quality: sharp and stabbing   Pain radiates to:  Does not radiate Pain radiates to the back: no   Pain severity:  Severe Onset quality:  Sudden Duration:  5 minutes Timing:  Sporadic Progression:  Resolved Chronicity:  New Context: at rest   Relieved by:  Nothing Worsened by:  Nothing tried Ineffective treatments:  None tried Associated symptoms: no fever and no shortness of breath   Risk factors: not pregnant     Past Medical History  Diagnosis Date  . Migraines   . Ear infection   . Normal labor 03/25/2012  . Postpartum care following vaginal delivery (7/22) 03/25/2012   Past Surgical History  Procedure Laterality Date  . Leep     No family history on file. History  Substance Use Topics  . Smoking status: Never Smoker   . Smokeless tobacco: Never Used  . Alcohol Use: Yes     Comment: rarely   OB History    Gravida Para Term Preterm AB TAB SAB Ectopic Multiple Living   0 0 0 0 0 0 1     Review of Systems  Constitutional: Negative for fever.  Respiratory: Negative for shortness of breath.   Cardiovascular: Positive for chest pain.  All other systems reviewed and are negative.     Allergies  Percocet  Home Medications   Prior to Admission medications   Medication Sig Start Date End Date Taking? Authorizing Provider  fluticasone (FLONASE) 50 MCG/ACT nasal spray Place 2 sprays into the nose daily. Patient taking differently: Place 2 sprays into the nose daily as needed for allergies.   04/05/13   Robyn M Hess, PA-C  ibuprofen (ADVIL,MOTRIN) 800 MG tablet Take 800 mg by mouth every 8 (eight) hours as needed for headache.    Historical Provider, MD   BP 141/93 mmHg  Pulse 68  Temp(Src) 98.1 F (36.7 C) (Oral)  Resp 20  Ht  (1.854 m)  Wt 320 lb (145.151 kg)  BMI 42.23 kg/m2  SpO2 100%  LMP 12/03/2014 Physical Exam  Constitutional: She is oriented to person, place, and time. She appears well-developed and well-nourished.  HENT:  Head: Normocephalic.  Right Ear: External ear normal.  Left Ear: External ear normal.  Eyes: Conjunctivae are normal. Pupils are equal, round, and reactive to light.  Neck: Normal range of motion. Neck supple.  Cardiovascular: Normal rate and normal heart sounds.   Pulmonary/Chest: Effort normal.  Abdominal: Soft.  Musculoskeletal: Normal range of motion.  Neurological: She is alert and oriented to person, place, and time. She has normal reflexes.  Skin: Skin is warm.  Psychiatric: She has a normal mood and affect.  Nursing note and vitals reviewed.   ED Course  Procedures (including critical care time) Labs Review Labs Reviewed  COMPREHENSIVE METABOLIC PANEL - Abnormal; Notable for the following:    Total Protein 8.7 (*)    Total Bilirubin 0.2 (*)    All other components within normal limits  URINALYSIS, ROUTINE W REFLEX MICROSCOPIC - Abnormal; Notable for  the following:    Color, Urine RED (*)    APPearance TURBID (*)    Hgb urine dipstick LARGE (*)    Leukocytes, UA TRACE (*)    All other components within normal limits  URINE MICROSCOPIC-ADD ON - Abnormal; Notable for the following:    Squamous Epithelial / LPF FEW (*)    Bacteria, UA FEW (*)    All other components within normal limits  CBC WITH DIFFERENTIAL/PLATELET  LIPASE, BLOOD  TROPONIN I    Imaging Review Dg Chest 2 View  12/04/2014   CLINICAL DATA:  Central chest pain  EXAM: CHEST  2 VIEW  COMPARISON:  None.  FINDINGS: The heart size and mediastinal contours  are within normal limits. Both lungs are clear. The visualized skeletal structures are unremarkable.  IMPRESSION: No active cardiopulmonary disease.   Electronically Signed   By: Alcide CleverMark  Lukens M.D.   On: 12/04/2014 19:54   [place] Abdomen Complete  12/04/2014   CLINICAL DATA:  Acute onset right upper quadrant and right back pain today. Nausea and vomiting. Abdominal bloating and increased gas for several weeks.  EXAM: ULTRASOUND ABDOMEN COMPLETE  COMPARISON:  CT on 03/24/2010  FINDINGS: Gallbladder: No gallstones or wall thickening visualized. No sonographic Murphy sign noted.  Common bile duct: Diameter: 3 mm, within normal limits  Liver: No focal lesion identified. Within normal limits in parenchymal echogenicity.  IVC: No abnormality visualized.  Pancreas: Visualized portion unremarkable.  Spleen: Size and appearance within normal limits.  Right Kidney: Length: 12.1 cm. Echogenicity within normal limits. No mass or hydronephrosis visualized.  Left Kidney: Length: 12.4 cm. Echogenicity within normal limits. No mass or hydronephrosis visualized.  Abdominal aorta: No aneurysm visualized.  Other findings: None.  IMPRESSION: Negative. No evidence of gallstones, biliary dilatation, or other acute findings.   Electronically Signed   By: Myles RosenthalJohn  Stahl M.D.   On: 12/04/2014 21:40     EKG Interpretation None      MDM   Final diagnoses:  Pain  Right upper quadrant pain    Pt reported relief with gi cocktail.  Pt given torodol and zofran,   Pt given rx for prevacid and hydrocodone.  Pt advised to see her MD for recheck.     Lonia SkinnerLeslie K Sinking SpringSofia, PA-C 12/04/14 2157  Jerelyn ScottMartha Linker, MD 12/04/14 2159

## 2016-04-27 IMAGING — CT CT HEAD W/O CM
2 series · 16 of 30 positions shown, 20 images · non-contrast
Comparison: None.

CLINICAL DATA: Pounding migraine-type headache arising from left
temporal region and extending posteriorly

EXAM:
CT HEAD WITHOUT CONTRAST
TECHNIQUE: Contiguous axial images were obtained from the base of the skull
through the vertex without intravenous contrast.

[Series 2: head w/o · axial · non-contrast · 0.45mm/px · z∈[-90,+30]mm · 13 of 28 slices shown, 17 images]
[im 2/28  brain]
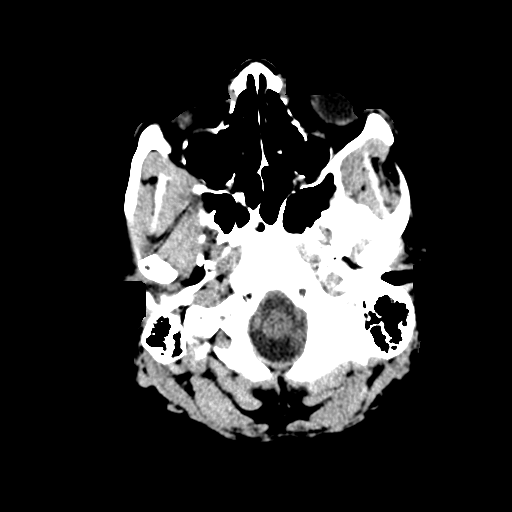
[im 2/28  bone]
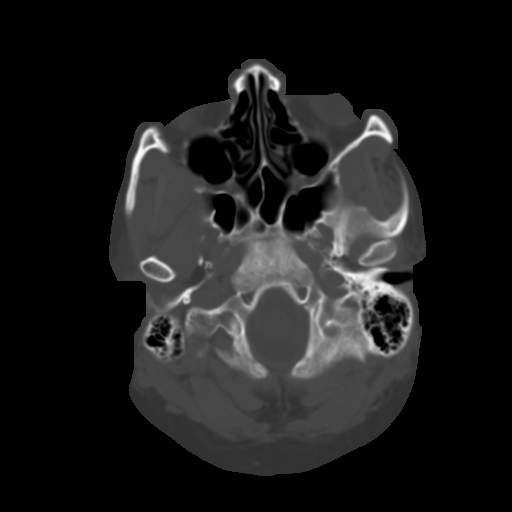
[im 4/28  brain]
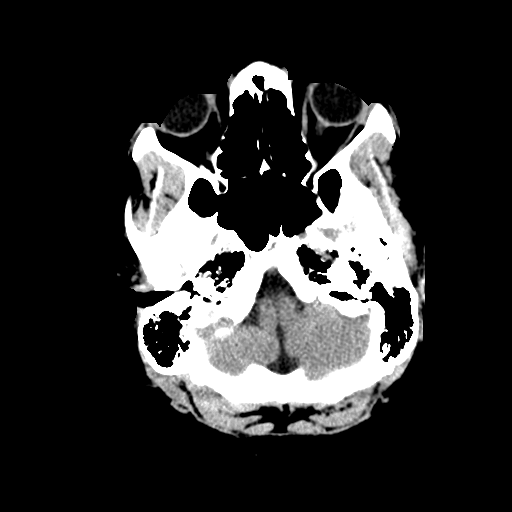
[im 6/28  brain]
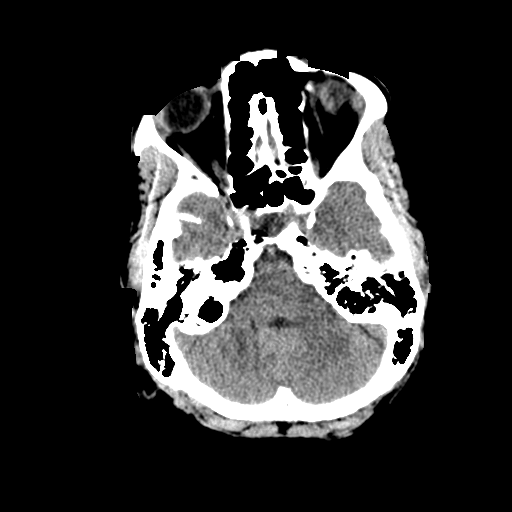
[im 8/28  brain]
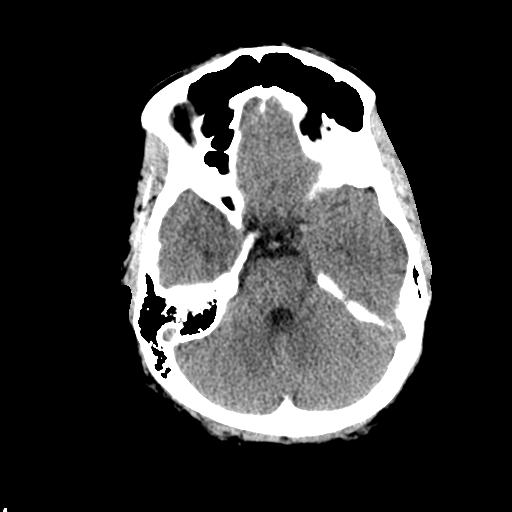
[im 10/28  brain]
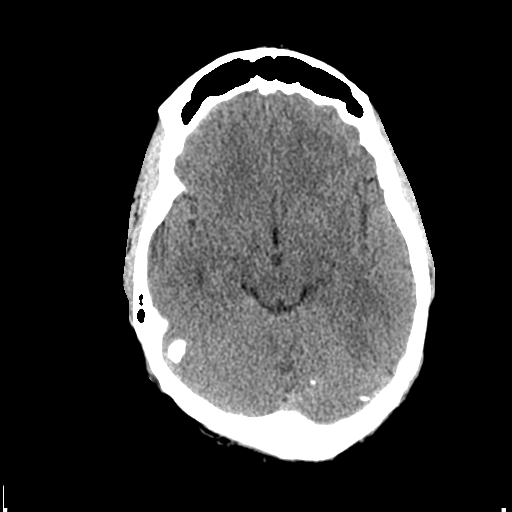
[im 10/28  bone]
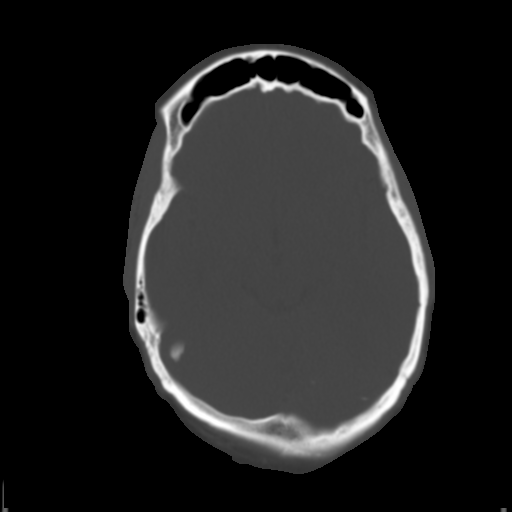
[im 12/28  brain]
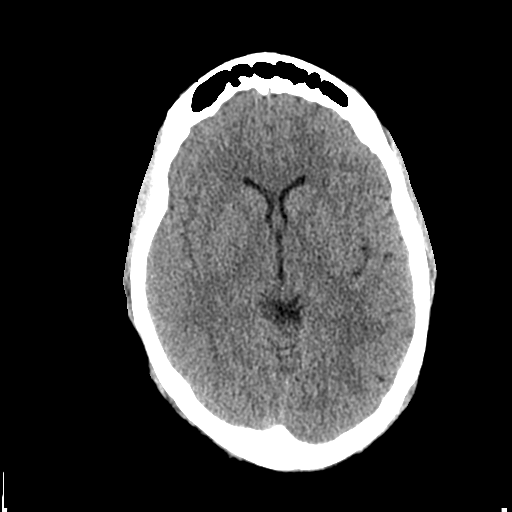
[im 14/28  brain]
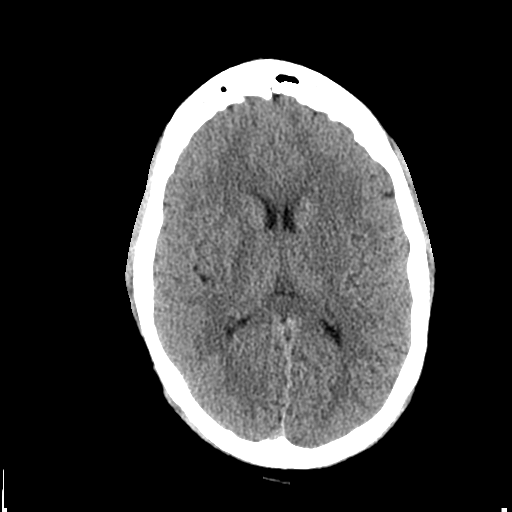
[im 16/28  brain]
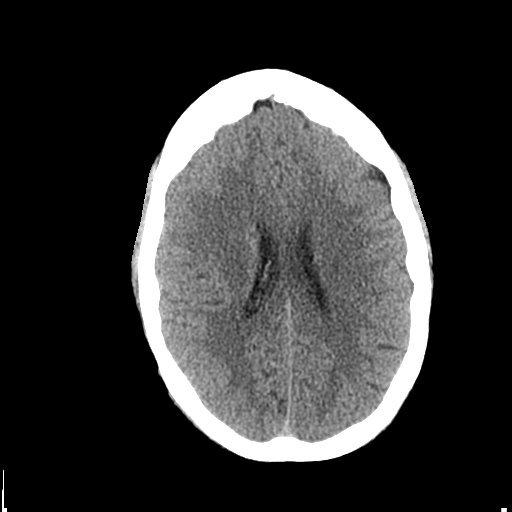
[im 18/28  brain]
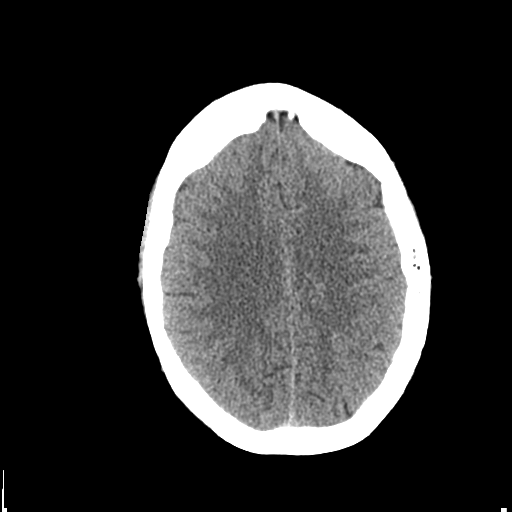
[im 18/28  bone]
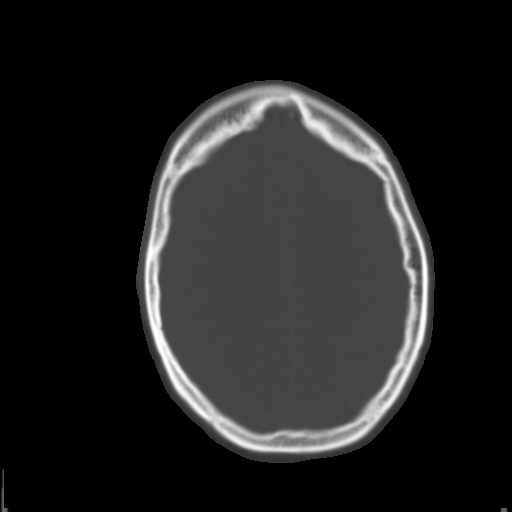
[im 20/28  brain]
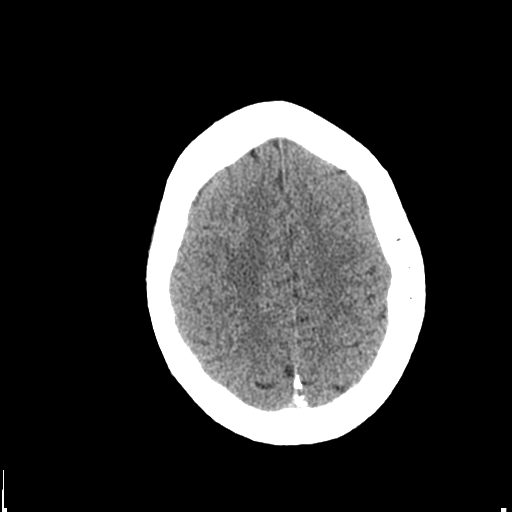
[im 22/28  brain]
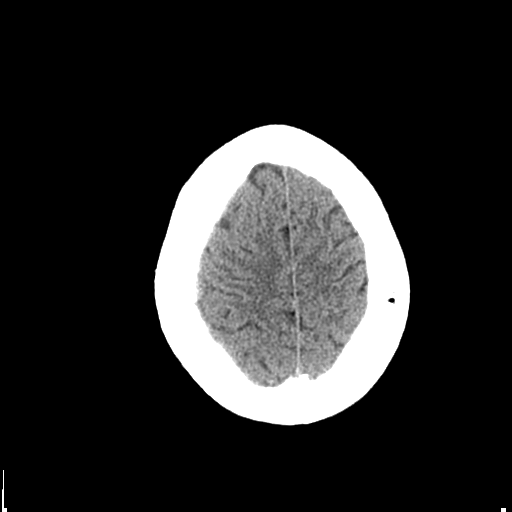
[im 24/28  brain]
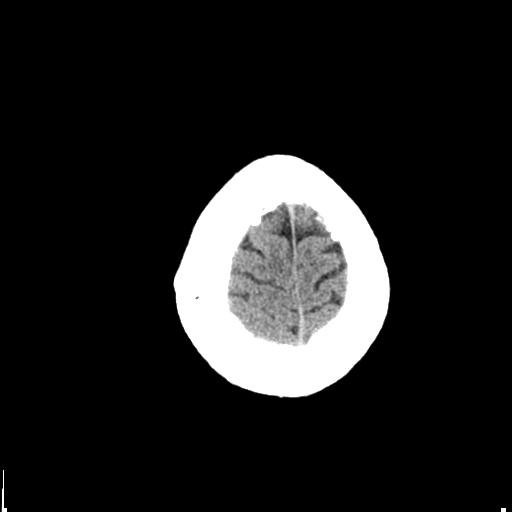
[im 26/28  brain]
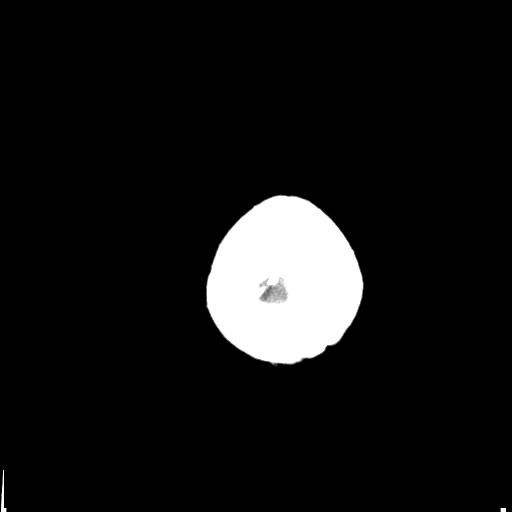
[im 26/28  bone]
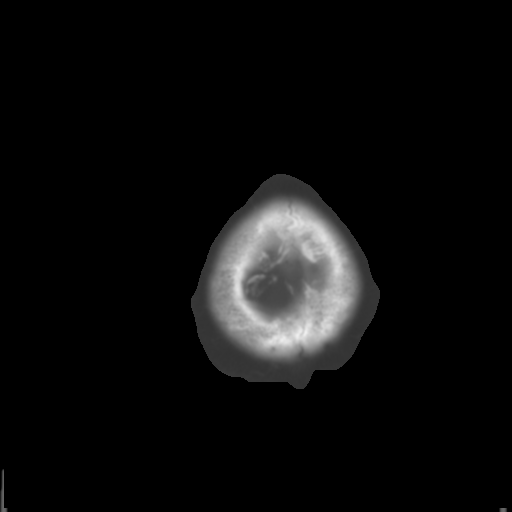

[Series 3: bone windows · axial · 0.45mm/px · z∈[-90,-50]mm · 3 of 28 slices shown]
[im 2/28  bone]
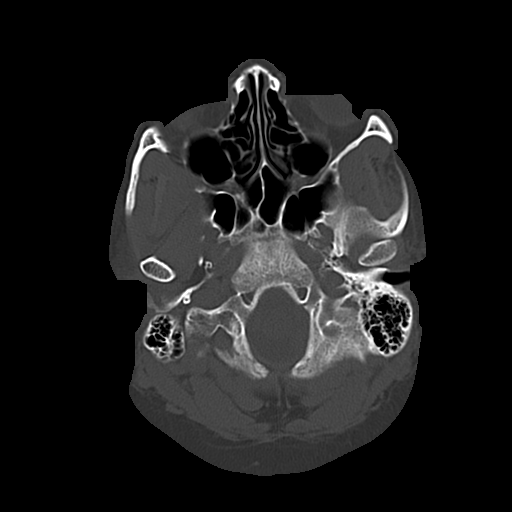
[im 6/28  bone]
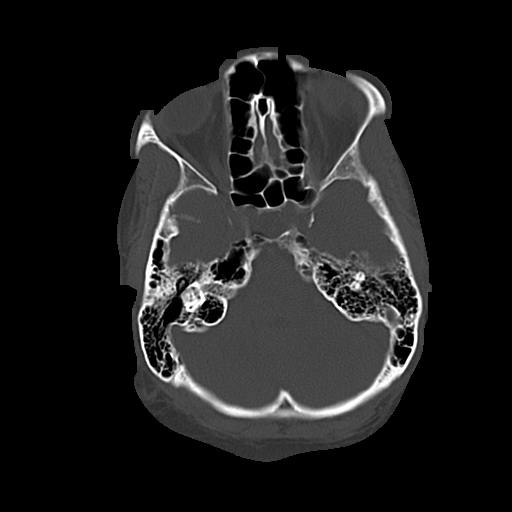
[im 10/28  bone]
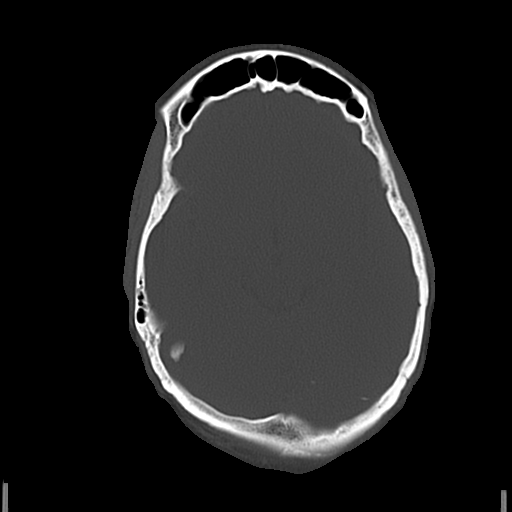

[16 of 30 positions shown; findings below may reference images not displayed]

FINDINGS: The ventricles are normal in size and configuration. There is no
mass, hemorrhage, extra-axial fluid collection, or midline shift.
Gray-white compartments appear normal. There is no demonstrable
acute infarct. The bony calvarium appears intact. The visualized
mastoid air cells are clear.
IMPRESSION: Study within normal limits. No intracranial mass, hemorrhage, or
focal gray -white compartment lesion.

## 2016-06-16 IMAGING — CR DG CHEST 2V
2 series · 2 of 2 positions shown · non-contrast
Comparison: None.

CLINICAL DATA: Central chest pain

EXAM:
CHEST  2 VIEW

[w chest pa]
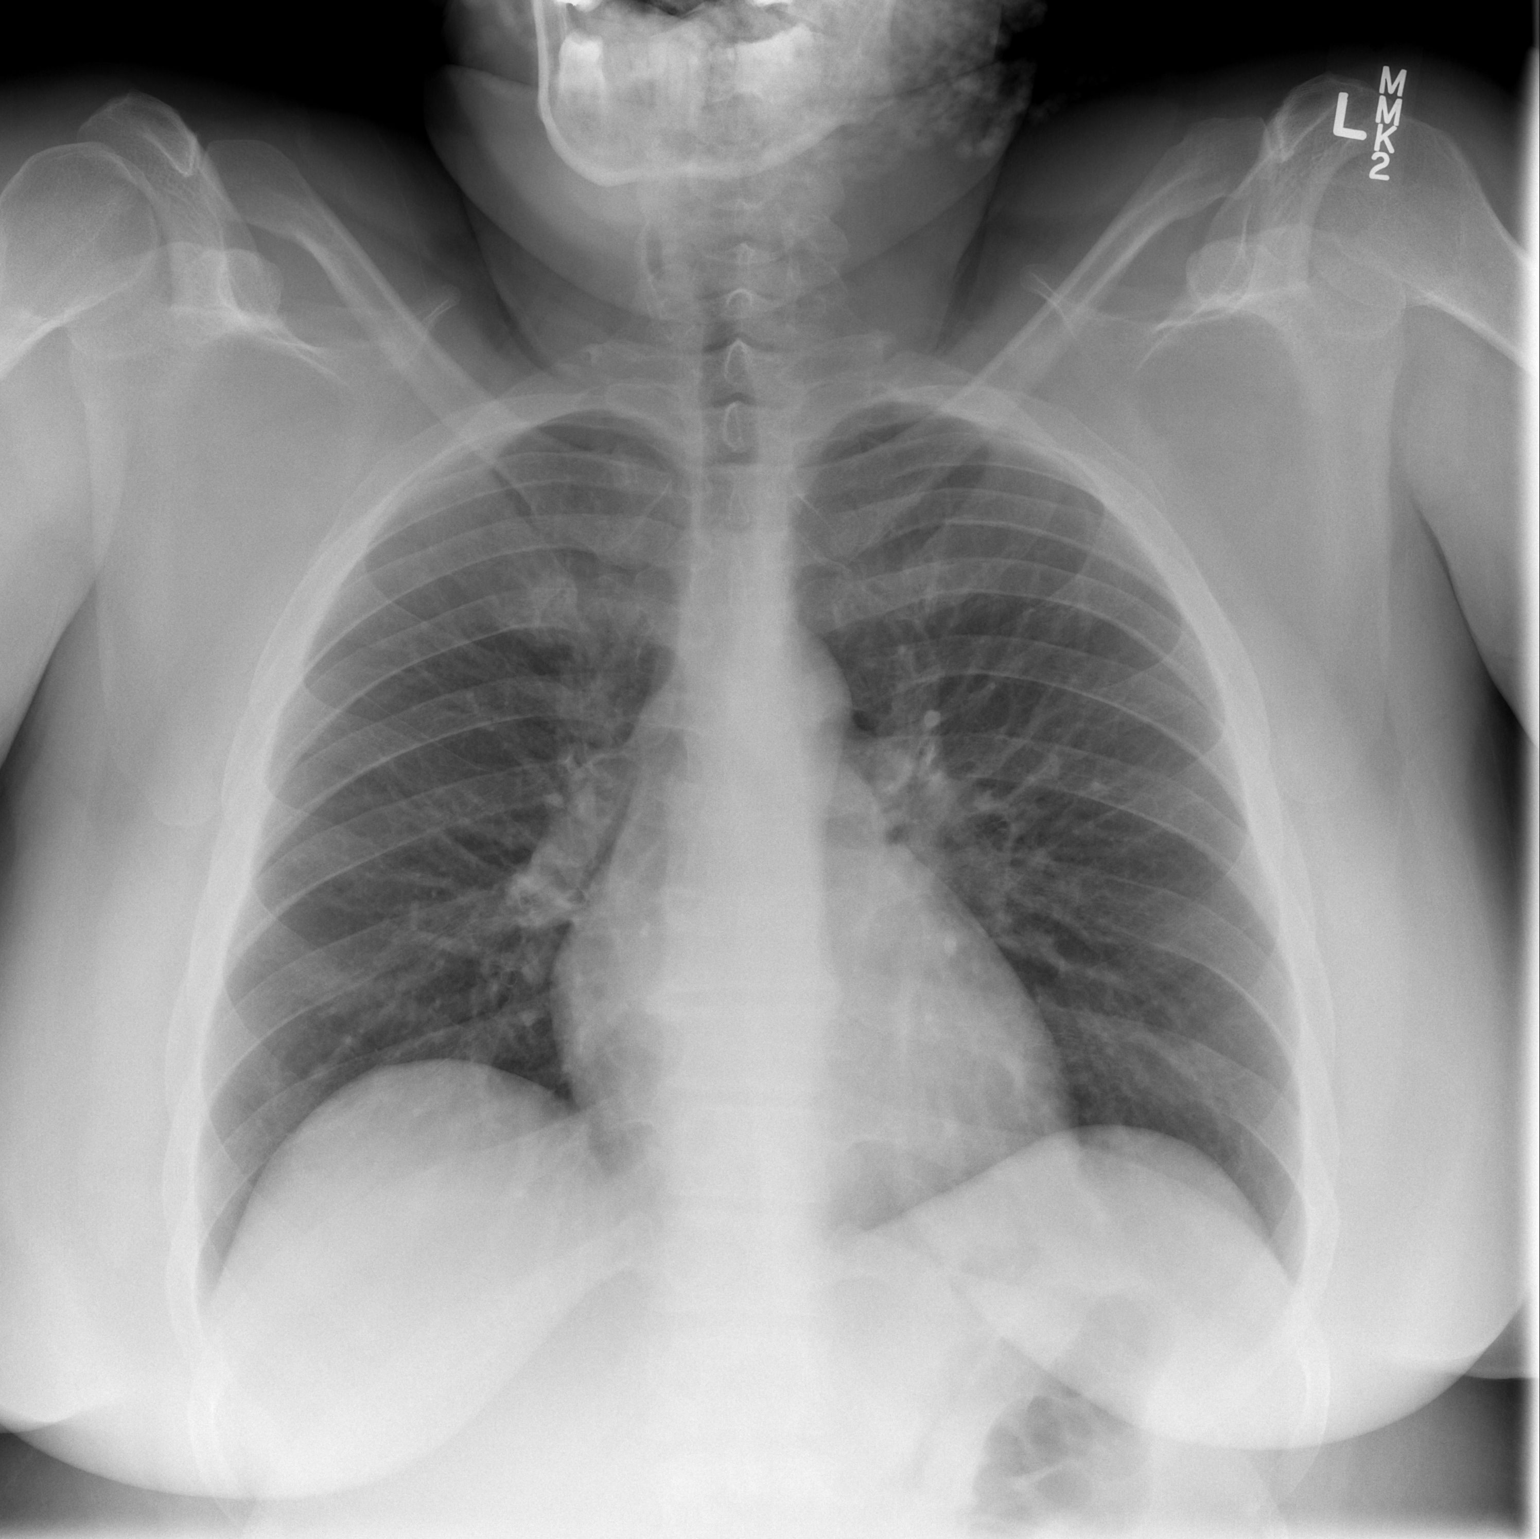

[w chest lat]
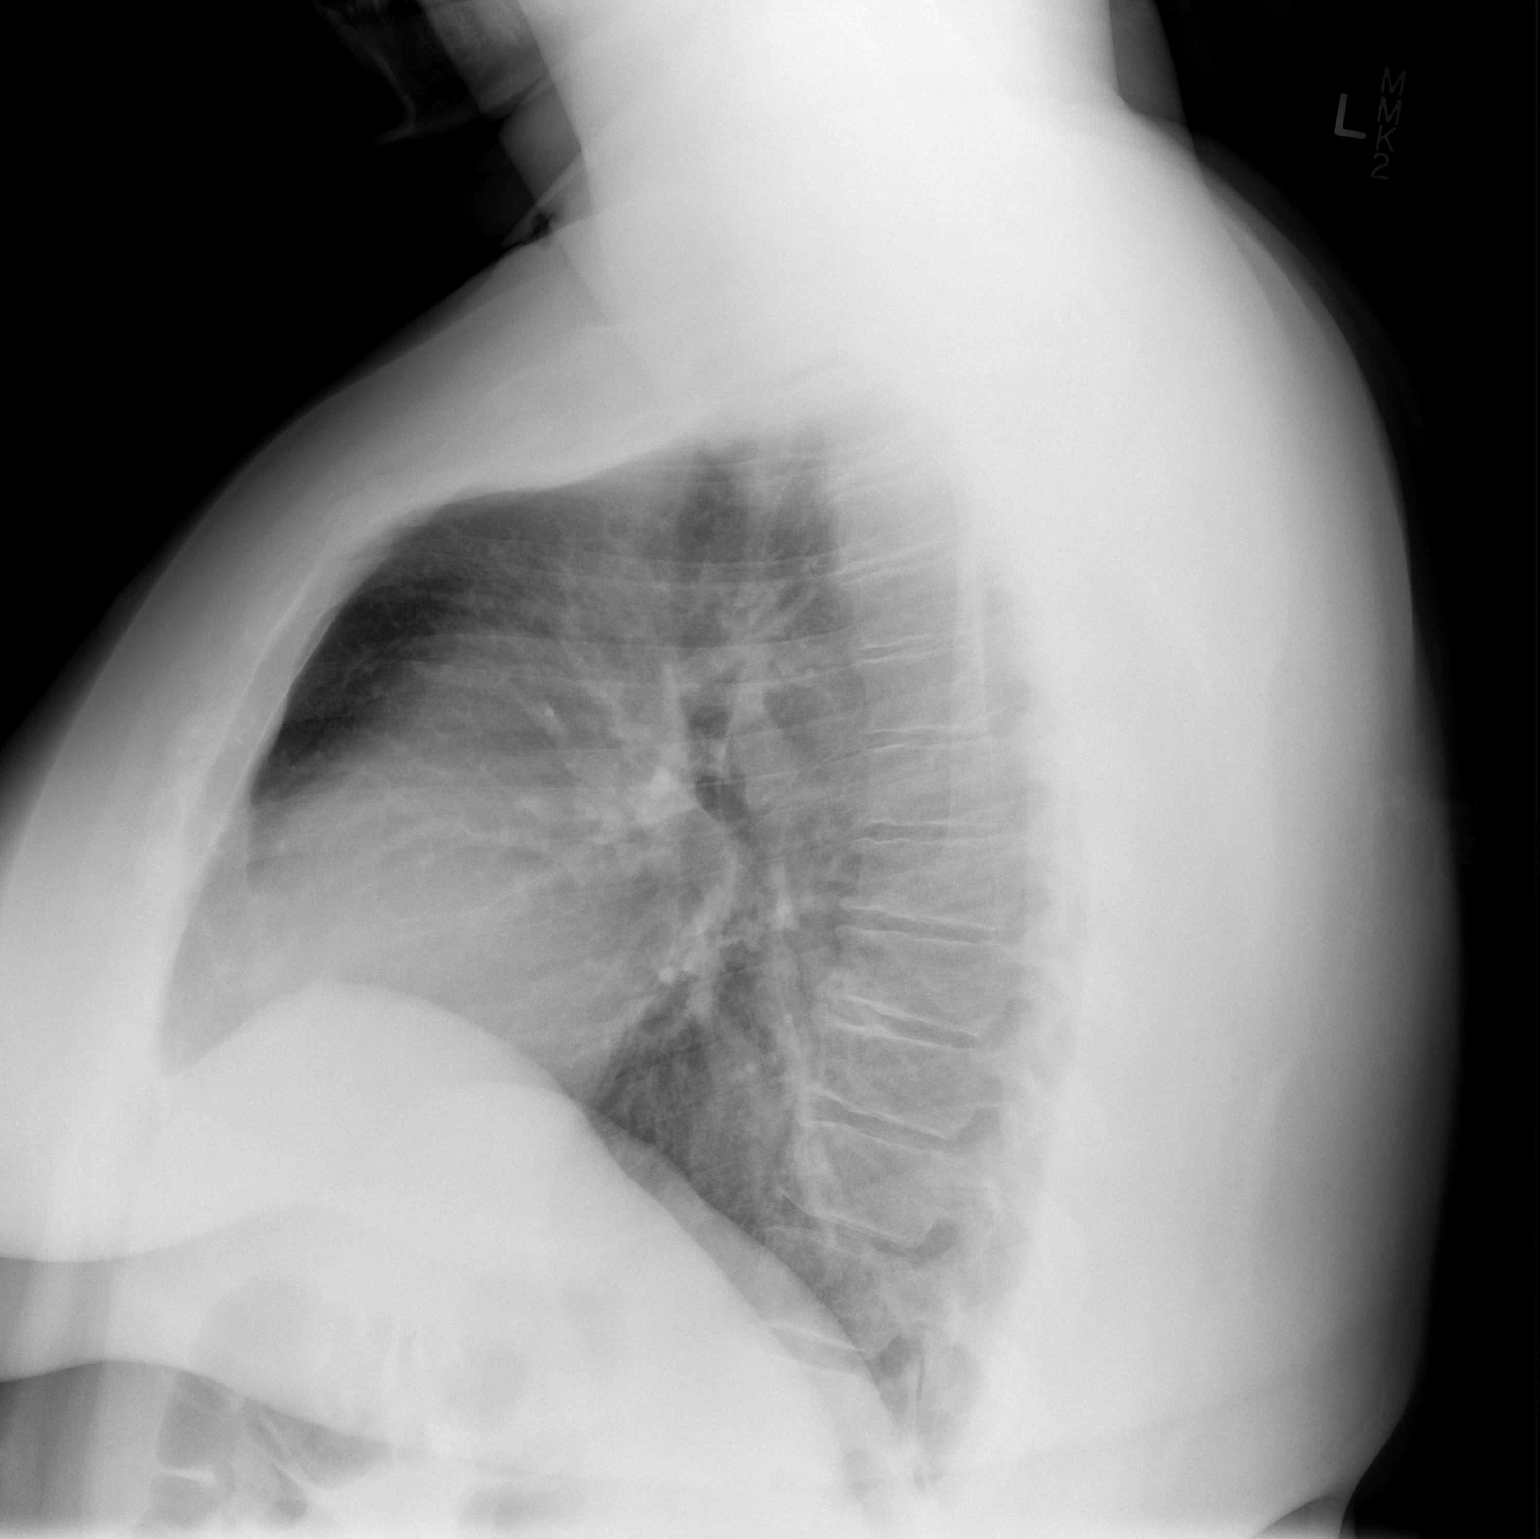

[2 of 2 positions shown; findings below may reference images not displayed]

FINDINGS: The heart size and mediastinal contours are within normal limits.
Both lungs are clear. The visualized skeletal structures are
unremarkable.
IMPRESSION: No active cardiopulmonary disease.

## 2016-06-16 IMAGING — US US ABDOMEN COMPLETE
1 series · 14 of 25 positions shown · non-contrast
Comparison: CT on 03/24/2010

CLINICAL DATA: Acute onset right upper quadrant and right back pain
today. Nausea and vomiting. Abdominal bloating and increased gas for
several weeks.

EXAM:
ULTRASOUND ABDOMEN COMPLETE

[Series 1: us abdomen complete · 0.25mm/px · 14 of 89 slices shown]
[im 1/89]
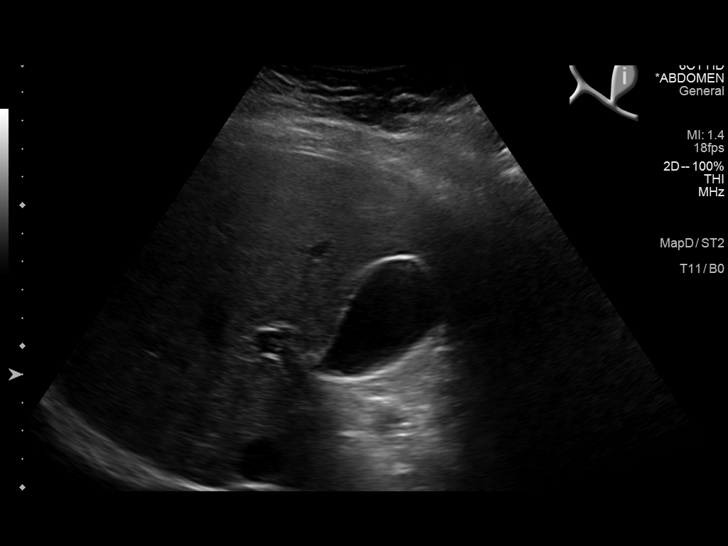
[im 8/89]
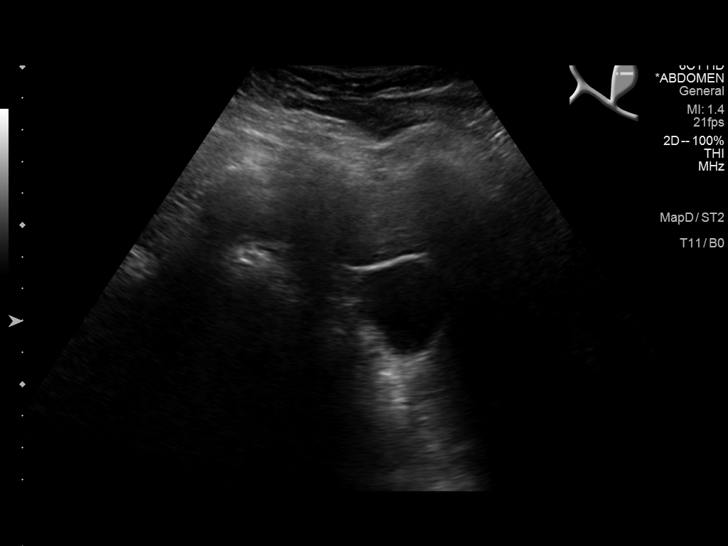
[im 15/89]
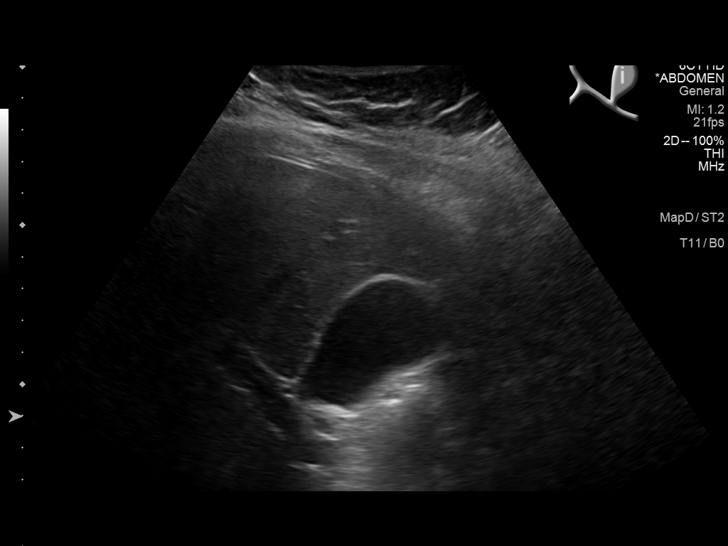
[im 23/89]
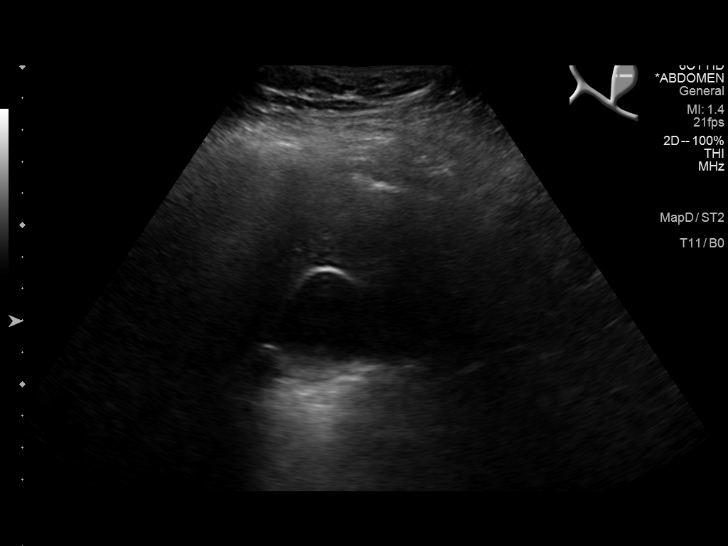
[im 30/89]
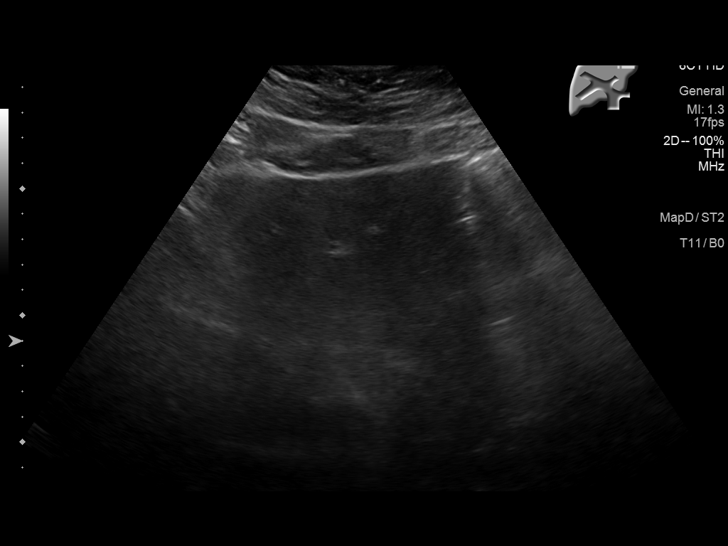
[im 34/89]
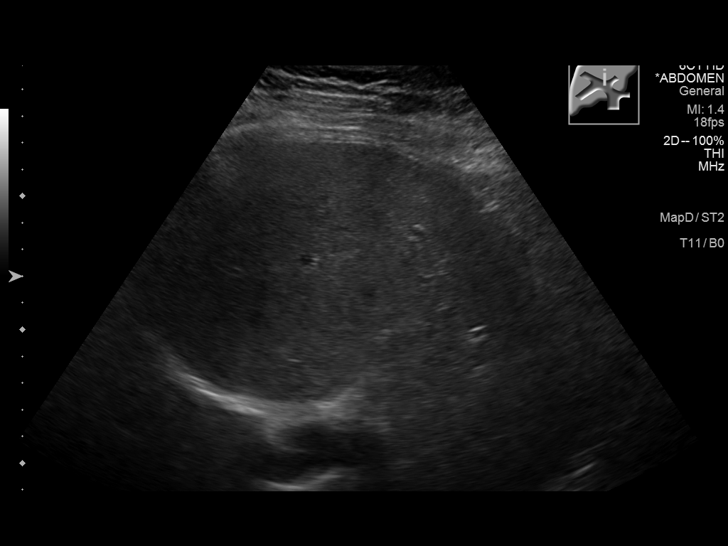
[im 41/89]
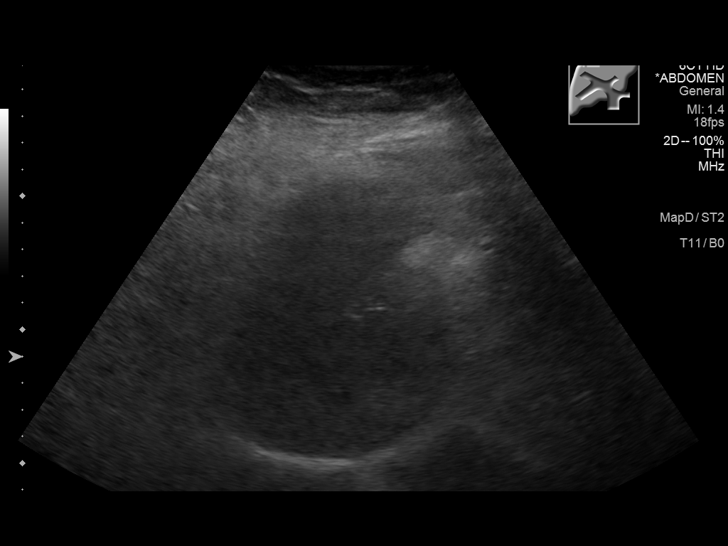
[im 48/89]
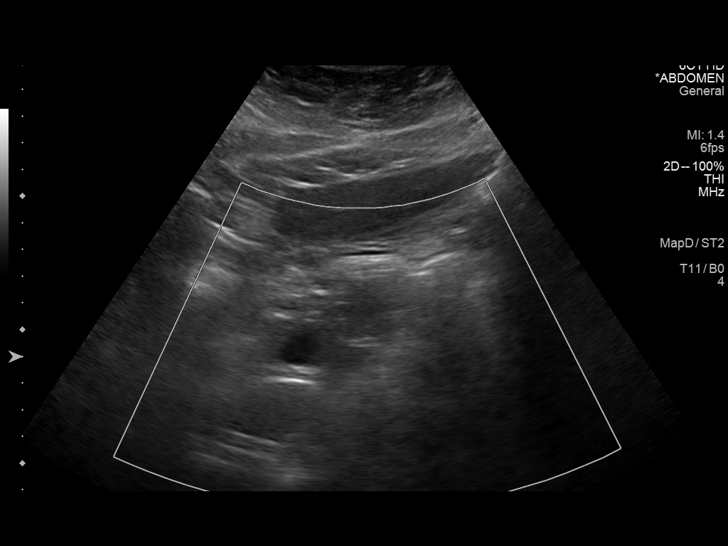
[im 56/89]
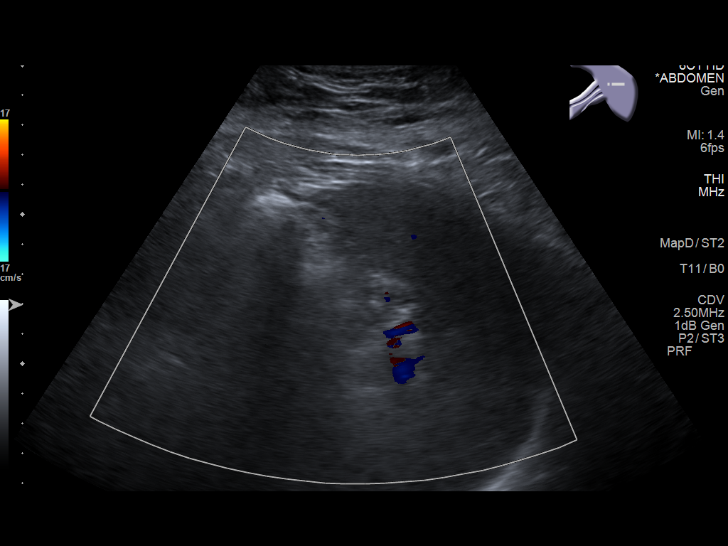
[im 59/89]
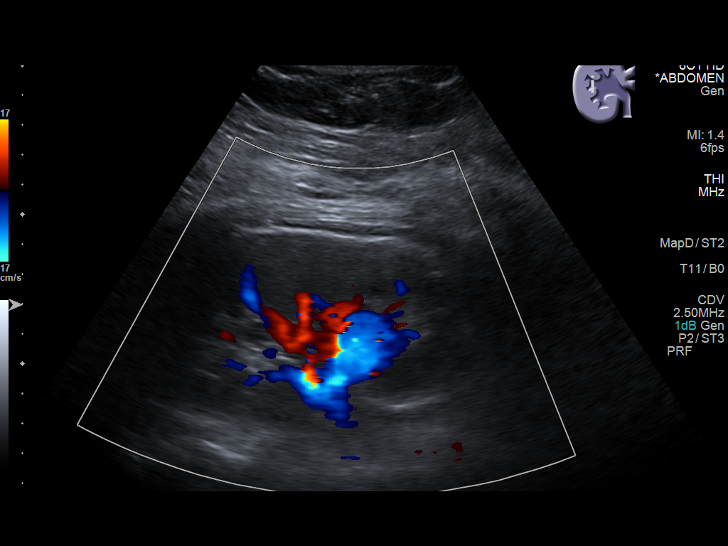
[im 67/89]
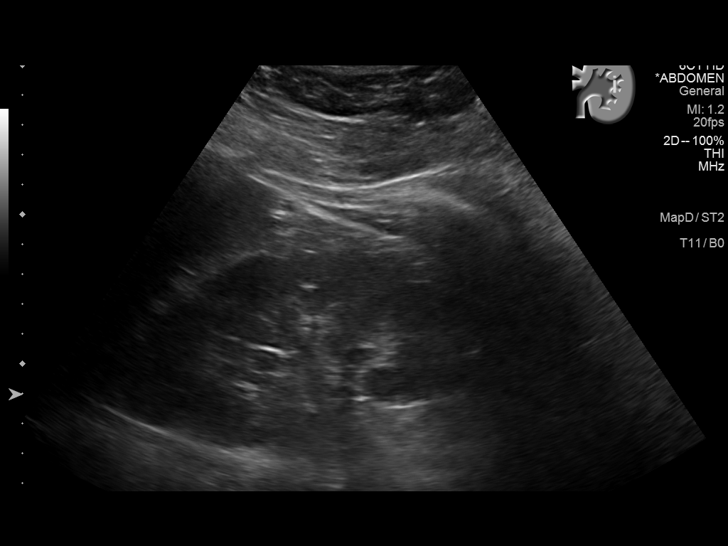
[im 74/89]
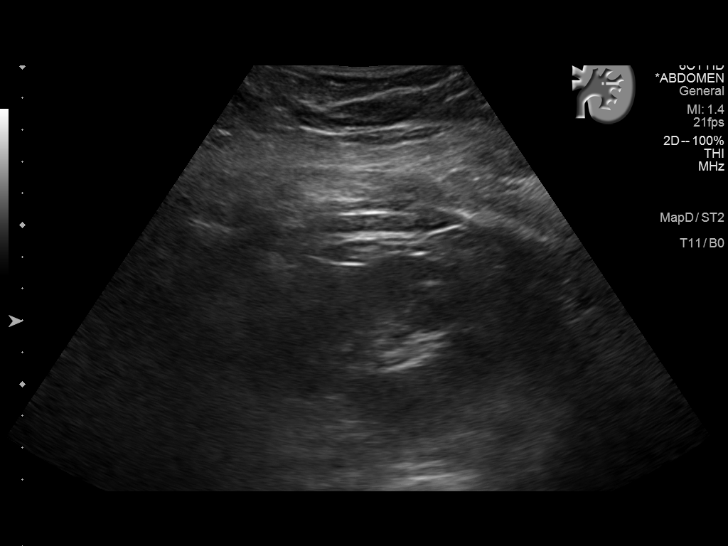
[im 81/89]
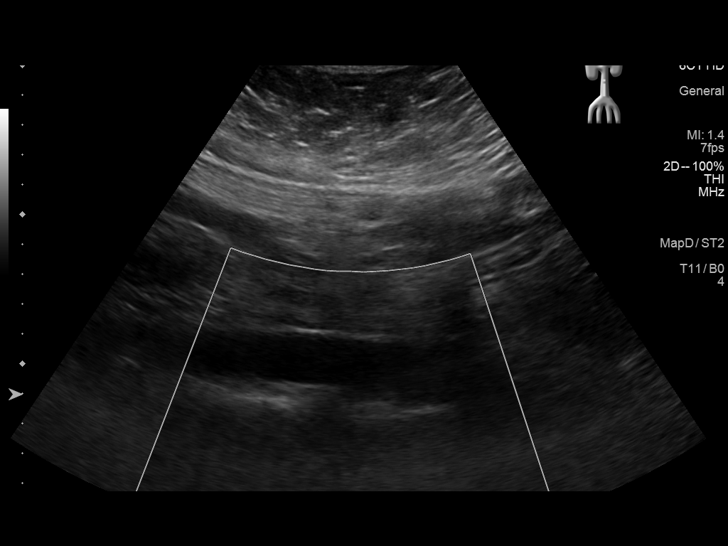
[im 89/89]
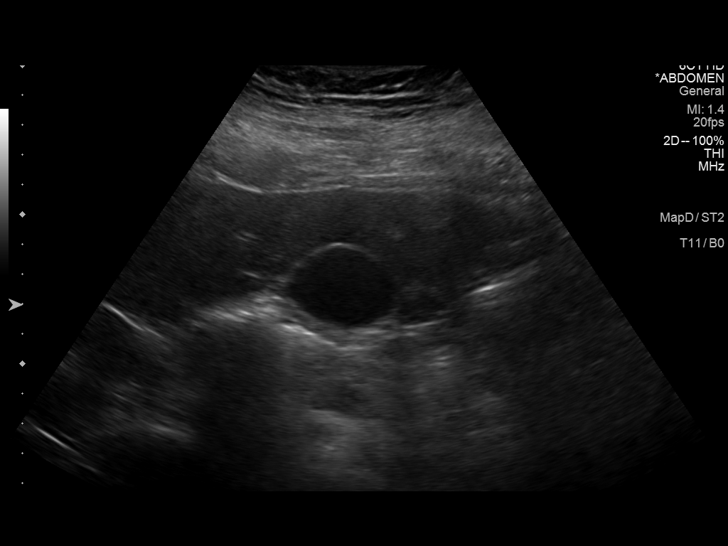

[14 of 25 positions shown; findings below may reference images not displayed]

FINDINGS: Gallbladder: No gallstones or wall thickening visualized. No
sonographic Murphy sign noted.

Common bile duct: Diameter: 3 mm, within normal limits

Liver: No focal lesion identified. Within normal limits in
parenchymal echogenicity.

IVC: No abnormality visualized.

Pancreas: Visualized portion unremarkable.

Spleen: Size and appearance within normal limits.

Right Kidney: Length: 12.1 cm. Echogenicity within normal limits. No
mass or hydronephrosis visualized.

Left Kidney: Length: 12.4 cm. Echogenicity within normal limits. No
mass or hydronephrosis visualized.

Abdominal aorta: No aneurysm visualized.

Other findings: None.
IMPRESSION: Negative. No evidence of gallstones, biliary dilatation, or other
acute findings.
